# Patient Record
Sex: Male | Born: 1990 | Race: Black or African American | Hispanic: No | Marital: Single | Smoking: Never smoker
Health system: Southern US, Community
[De-identification: ages and names within clinical notes are randomized; demographics above are authoritative.]

---

## 2013-08-23 ENCOUNTER — Emergency Department: Payer: Self-pay | Admitting: Emergency Medicine

## 2013-09-10 ENCOUNTER — Emergency Department: Payer: Self-pay | Admitting: Emergency Medicine

## 2013-09-15 ENCOUNTER — Emergency Department: Payer: Self-pay | Admitting: Emergency Medicine

## 2013-09-16 ENCOUNTER — Emergency Department: Payer: Self-pay | Admitting: Emergency Medicine

## 2014-06-15 ENCOUNTER — Emergency Department: Payer: Self-pay | Admitting: Emergency Medicine

## 2014-06-15 LAB — URINALYSIS, COMPLETE
BACTERIA: NONE SEEN
BLOOD: NEGATIVE
Bilirubin,UR: NEGATIVE
GLUCOSE, UR: NEGATIVE mg/dL (ref 0–75)
KETONE: NEGATIVE
LEUKOCYTE ESTERASE: NEGATIVE
NITRITE: NEGATIVE
PH: 5 (ref 4.5–8.0)
Protein: NEGATIVE
SPECIFIC GRAVITY: 1.029 (ref 1.003–1.030)
Squamous Epithelial: <1

## 2014-06-15 LAB — COMPREHENSIVE METABOLIC PANEL
Albumin: 3.7 g/dL (ref 3.4–5.0)
Alkaline Phosphatase: 80 U/L
Anion Gap: 8 (ref 7–16)
BILIRUBIN TOTAL: 0.5 mg/dL (ref 0.2–1.0)
BUN: 13 mg/dL (ref 7–18)
Calcium, Total: 8.8 mg/dL (ref 8.5–10.1)
Chloride: 105 mmol/L (ref 98–107)
Co2: 29 mmol/L (ref 21–32)
Creatinine: 1.09 mg/dL (ref 0.60–1.30)
EGFR (African American): >60
EGFR (Non-African Amer.): >60
Glucose: 107 mg/dL — ABNORMAL HIGH (ref 65–99)
Osmolality: 284 (ref 275–301)
Potassium: 3.7 mmol/L (ref 3.5–5.1)
SGOT(AST): 20 U/L (ref 15–37)
SGPT (ALT): 26 U/L (ref 12–78)
Sodium: 142 mmol/L (ref 136–145)
TOTAL PROTEIN: 7.5 g/dL (ref 6.4–8.2)

## 2014-06-15 LAB — CBC WITH DIFFERENTIAL/PLATELET
BASOS ABS: 0 10*3/uL (ref 0.0–0.1)
Basophil %: 0.2 %
EOS PCT: 0.8 %
Eosinophil #: 0 10*3/uL (ref 0.0–0.7)
HCT: 43.3 % (ref 40.0–52.0)
HGB: 14.1 g/dL (ref 13.0–18.0)
LYMPHS ABS: 0.9 10*3/uL — AB (ref 1.0–3.6)
Lymphocyte %: 19.4 %
MCH: 30.3 pg (ref 26.0–34.0)
MCHC: 32.7 g/dL (ref 32.0–36.0)
MCV: 93 fL (ref 80–100)
MONOS PCT: 11.5 %
Monocyte #: 0.5 x10 3/mm (ref 0.2–1.0)
NEUTROS PCT: 68.1 %
Neutrophil #: 3.2 10*3/uL (ref 1.4–6.5)
PLATELETS: 153 10*3/uL (ref 150–440)
RBC: 4.66 10*6/uL (ref 4.40–5.90)
RDW: 13 % (ref 11.5–14.5)
WBC: 4.7 10*3/uL (ref 3.8–10.6)

## 2014-06-15 LAB — CK: CK, Total: 310 U/L — ABNORMAL HIGH

## 2014-06-15 LAB — TROPONIN I: Troponin-I: <0.02 ng/mL

## 2014-06-15 LAB — LIPASE, BLOOD: Lipase: 143 U/L (ref 73–393)

## 2014-08-13 ENCOUNTER — Emergency Department: Payer: Self-pay | Admitting: Emergency Medicine

## 2014-08-13 LAB — COMPREHENSIVE METABOLIC PANEL
ALBUMIN: 3.8 g/dL (ref 3.4–5.0)
ALT: 30 U/L
ANION GAP: 9 (ref 7–16)
Alkaline Phosphatase: 64 U/L
BUN: 15 mg/dL (ref 7–18)
Bilirubin,Total: 0.4 mg/dL (ref 0.2–1.0)
CALCIUM: 9 mg/dL (ref 8.5–10.1)
CHLORIDE: 108 mmol/L — AB (ref 98–107)
CREATININE: 1.07 mg/dL (ref 0.60–1.30)
Co2: 24 mmol/L (ref 21–32)
GLUCOSE: 84 mg/dL (ref 65–99)
Osmolality: 281 (ref 275–301)
POTASSIUM: 3.8 mmol/L (ref 3.5–5.1)
SGOT(AST): 27 U/L (ref 15–37)
Sodium: 141 mmol/L (ref 136–145)
Total Protein: 7.1 g/dL (ref 6.4–8.2)

## 2014-08-13 LAB — CBC
HCT: 42.5 % (ref 40.0–52.0)
HGB: 13.8 g/dL (ref 13.0–18.0)
MCH: 30.4 pg (ref 26.0–34.0)
MCHC: 32.5 g/dL (ref 32.0–36.0)
MCV: 94 fL (ref 80–100)
PLATELETS: 151 10*3/uL (ref 150–440)
RBC: 4.54 10*6/uL (ref 4.40–5.90)
RDW: 13.9 % (ref 11.5–14.5)
WBC: 3.9 10*3/uL (ref 3.8–10.6)

## 2014-08-13 LAB — LIPASE, BLOOD: Lipase: 160 U/L (ref 73–393)

## 2014-09-03 ENCOUNTER — Emergency Department: Payer: Self-pay | Admitting: Emergency Medicine

## 2014-09-03 LAB — URINALYSIS, COMPLETE
BACTERIA: NONE SEEN
BILIRUBIN, UR: NEGATIVE
Blood: NEGATIVE
GLUCOSE, UR: NEGATIVE mg/dL (ref 0–75)
KETONE: NEGATIVE
LEUKOCYTE ESTERASE: NEGATIVE
NITRITE: NEGATIVE
Ph: 7 (ref 4.5–8.0)
Protein: NEGATIVE
Specific Gravity: 1.014 (ref 1.003–1.030)
Squamous Epithelial: <1
WBC UR: 2 /HPF (ref 0–5)

## 2014-09-03 LAB — CBC WITH DIFFERENTIAL/PLATELET
BASOS ABS: 0 10*3/uL (ref 0.0–0.1)
BASOS PCT: 0.5 %
EOS ABS: 0.1 10*3/uL (ref 0.0–0.7)
EOS PCT: 2 %
HCT: 40 % (ref 40.0–52.0)
HGB: 13.6 g/dL (ref 13.0–18.0)
LYMPHS ABS: 2.4 10*3/uL (ref 1.0–3.6)
Lymphocyte %: 41.5 %
MCH: 30.9 pg (ref 26.0–34.0)
MCHC: 33.9 g/dL (ref 32.0–36.0)
MCV: 91 fL (ref 80–100)
MONOS PCT: 8.6 %
Monocyte #: 0.5 x10 3/mm (ref 0.2–1.0)
NEUTROS PCT: 47.4 %
Neutrophil #: 2.7 10*3/uL (ref 1.4–6.5)
PLATELETS: 149 10*3/uL — AB (ref 150–440)
RBC: 4.39 10*6/uL — ABNORMAL LOW (ref 4.40–5.90)
RDW: 13.5 % (ref 11.5–14.5)
WBC: 5.7 10*3/uL (ref 3.8–10.6)

## 2014-09-03 LAB — LIPASE, BLOOD: Lipase: 173 U/L (ref 73–393)

## 2014-09-03 LAB — COMPREHENSIVE METABOLIC PANEL
ANION GAP: 5 — AB (ref 7–16)
AST: 22 U/L (ref 15–37)
Albumin: 3.7 g/dL (ref 3.4–5.0)
Alkaline Phosphatase: 65 U/L
BUN: 17 mg/dL (ref 7–18)
Bilirubin,Total: 0.4 mg/dL (ref 0.2–1.0)
CALCIUM: 8.6 mg/dL (ref 8.5–10.1)
CO2: 29 mmol/L (ref 21–32)
Chloride: 103 mmol/L (ref 98–107)
Creatinine: 0.97 mg/dL (ref 0.60–1.30)
EGFR (African American): >60
EGFR (Non-African Amer.): >60
Glucose: 87 mg/dL (ref 65–99)
OSMOLALITY: 275 (ref 275–301)
Potassium: 3.7 mmol/L (ref 3.5–5.1)
SGPT (ALT): 28 U/L
SODIUM: 137 mmol/L (ref 136–145)
TOTAL PROTEIN: 6.8 g/dL (ref 6.4–8.2)

## 2014-09-08 ENCOUNTER — Emergency Department: Payer: Self-pay | Admitting: Emergency Medicine

## 2014-09-08 LAB — CBC WITH DIFFERENTIAL/PLATELET
BASOS ABS: 0 10*3/uL (ref 0.0–0.1)
Basophil %: 0.5 %
EOS ABS: 0 10*3/uL (ref 0.0–0.7)
EOS PCT: 0.7 %
HCT: 43.1 % (ref 40.0–52.0)
HGB: 14 g/dL (ref 13.0–18.0)
LYMPHS ABS: 1.5 10*3/uL (ref 1.0–3.6)
Lymphocyte %: 30.2 %
MCH: 30.1 pg (ref 26.0–34.0)
MCHC: 32.4 g/dL (ref 32.0–36.0)
MCV: 93 fL (ref 80–100)
Monocyte #: 0.3 x10 3/mm (ref 0.2–1.0)
Monocyte %: 6.1 %
NEUTROS ABS: 3 10*3/uL (ref 1.4–6.5)
Neutrophil %: 62.5 %
PLATELETS: 151 10*3/uL (ref 150–440)
RBC: 4.64 10*6/uL (ref 4.40–5.90)
RDW: 13.5 % (ref 11.5–14.5)
WBC: 4.9 10*3/uL (ref 3.8–10.6)

## 2014-09-08 LAB — COMPREHENSIVE METABOLIC PANEL
ALBUMIN: 4 g/dL (ref 3.4–5.0)
ALK PHOS: 54 U/L
ANION GAP: 7 (ref 7–16)
AST: 27 U/L (ref 15–37)
BILIRUBIN TOTAL: 0.7 mg/dL (ref 0.2–1.0)
BUN: 12 mg/dL (ref 7–18)
CHLORIDE: 105 mmol/L (ref 98–107)
Calcium, Total: 8.7 mg/dL (ref 8.5–10.1)
Co2: 29 mmol/L (ref 21–32)
Creatinine: 1.09 mg/dL (ref 0.60–1.30)
EGFR (African American): >60
GLUCOSE: 106 mg/dL — AB (ref 65–99)
Osmolality: 281 (ref 275–301)
POTASSIUM: 3.5 mmol/L (ref 3.5–5.1)
SGPT (ALT): 28 U/L
Sodium: 141 mmol/L (ref 136–145)
Total Protein: 7.4 g/dL (ref 6.4–8.2)

## 2014-09-08 LAB — URINALYSIS, COMPLETE
BILIRUBIN, UR: NEGATIVE
Bacteria: NONE SEEN
Blood: NEGATIVE
GLUCOSE, UR: NEGATIVE mg/dL (ref 0–75)
Leukocyte Esterase: NEGATIVE
Nitrite: NEGATIVE
Ph: 7 (ref 4.5–8.0)
Protein: NEGATIVE
RBC,UR: NONE SEEN /HPF (ref 0–5)
SPECIFIC GRAVITY: 1.017 (ref 1.003–1.030)
SQUAMOUS EPITHELIAL: NONE SEEN
WBC UR: NONE SEEN /HPF (ref 0–5)

## 2014-09-09 LAB — TROPONIN I

## 2014-12-11 ENCOUNTER — Emergency Department: Payer: Self-pay | Admitting: Emergency Medicine

## 2015-03-08 ENCOUNTER — Emergency Department
Admit: 2015-03-08 | Disposition: A | Discharge status: Home / Self Care | Payer: Self-pay | Admitting: Emergency Medicine

## 2015-03-08 LAB — URINALYSIS, COMPLETE
Bacteria: NONE SEEN
Bilirubin,UR: NEGATIVE
Blood: NEGATIVE
GLUCOSE, UR: NEGATIVE mg/dL (ref 0–75)
Ketone: NEGATIVE
LEUKOCYTE ESTERASE: NEGATIVE
NITRITE: NEGATIVE
Ph: 5 (ref 4.5–8.0)
Protein: NEGATIVE
Specific Gravity: 1.032 (ref 1.003–1.030)
Squamous Epithelial: NONE SEEN

## 2015-03-08 LAB — CBC WITH DIFFERENTIAL/PLATELET
Basophil #: 0 10*3/uL (ref 0.0–0.1)
Basophil %: 0.4 %
EOS ABS: 0.1 10*3/uL (ref 0.0–0.7)
Eosinophil %: 1.3 %
HCT: 42.6 % (ref 40.0–52.0)
HGB: 14 g/dL (ref 13.0–18.0)
Lymphocyte #: 2.3 10*3/uL (ref 1.0–3.6)
Lymphocyte %: 41 %
MCH: 30.1 pg (ref 26.0–34.0)
MCHC: 32.9 g/dL (ref 32.0–36.0)
MCV: 91 fL (ref 80–100)
MONO ABS: 0.5 x10 3/mm (ref 0.2–1.0)
Monocyte %: 9.1 %
NEUTROS PCT: 48.2 %
Neutrophil #: 2.7 10*3/uL (ref 1.4–6.5)
PLATELETS: 147 10*3/uL — AB (ref 150–440)
RBC: 4.66 10*6/uL (ref 4.40–5.90)
RDW: 13.2 % (ref 11.5–14.5)
WBC: 5.7 10*3/uL (ref 3.8–10.6)

## 2015-03-08 LAB — COMPREHENSIVE METABOLIC PANEL
ALK PHOS: 54 U/L
ANION GAP: 10 (ref 7–16)
Albumin: 4.5 g/dL
BUN: 16 mg/dL
Bilirubin,Total: 0.3 mg/dL
CALCIUM: 9.2 mg/dL
CO2: 26 mmol/L
Chloride: 105 mmol/L
Creatinine: 0.83 mg/dL
EGFR (African American): >60
EGFR (Non-African Amer.): >60
Glucose: 99 mg/dL
Potassium: 3.4 mmol/L — ABNORMAL LOW
SGOT(AST): 50 U/L — ABNORMAL HIGH
SGPT (ALT): 80 U/L — ABNORMAL HIGH
SODIUM: 141 mmol/L
Total Protein: 7.5 g/dL

## 2015-04-01 ENCOUNTER — Emergency Department: Payer: Medicaid Other

## 2015-04-01 ENCOUNTER — Emergency Department
Admission: EM | Admit: 2015-04-01 | Discharge: 2015-04-01 | Disposition: A | Discharge status: Home / Self Care | Payer: Medicaid Other | Attending: Emergency Medicine | Admitting: Emergency Medicine

## 2015-04-01 ENCOUNTER — Encounter: Payer: Self-pay | Admitting: Emergency Medicine

## 2015-04-01 DIAGNOSIS — N179 Acute kidney failure, unspecified: Secondary | ICD-10-CM | POA: Insufficient documentation

## 2015-04-01 DIAGNOSIS — R1011 Right upper quadrant pain: Secondary | ICD-10-CM

## 2015-04-01 DIAGNOSIS — G8929 Other chronic pain: Secondary | ICD-10-CM | POA: Insufficient documentation

## 2015-04-01 DIAGNOSIS — N289 Disorder of kidney and ureter, unspecified: Secondary | ICD-10-CM

## 2015-04-01 DIAGNOSIS — R1033 Periumbilical pain: Secondary | ICD-10-CM | POA: Insufficient documentation

## 2015-04-01 LAB — COMPREHENSIVE METABOLIC PANEL
ALBUMIN: 4.5 g/dL (ref 3.5–5.0)
ALT: 32 U/L (ref 17–63)
ANION GAP: 8 (ref 5–15)
AST: 32 U/L (ref 15–41)
Alkaline Phosphatase: 55 U/L (ref 38–126)
BILIRUBIN TOTAL: 0.7 mg/dL (ref 0.3–1.2)
BUN: 21 mg/dL — AB (ref 6–20)
CO2: 29 mmol/L (ref 22–32)
CREATININE: 1.26 mg/dL — AB (ref 0.61–1.24)
Calcium: 9.6 mg/dL (ref 8.9–10.3)
Chloride: 105 mmol/L (ref 101–111)
Glucose, Bld: 92 mg/dL (ref 65–99)
Potassium: 4 mmol/L (ref 3.5–5.1)
Sodium: 142 mmol/L (ref 135–145)
Total Protein: 7.7 g/dL (ref 6.5–8.1)

## 2015-04-01 LAB — URINALYSIS COMPLETE WITH MICROSCOPIC (ARMC ONLY)
Bacteria, UA: NONE SEEN
Bilirubin Urine: NEGATIVE
GLUCOSE, UA: NEGATIVE mg/dL
HGB URINE DIPSTICK: NEGATIVE
Ketones, ur: NEGATIVE mg/dL
Leukocytes, UA: NEGATIVE
Nitrite: NEGATIVE
Protein, ur: NEGATIVE mg/dL
SPECIFIC GRAVITY, URINE: 1.03 (ref 1.005–1.030)
pH: 5 (ref 5.0–8.0)

## 2015-04-01 LAB — CBC WITH DIFFERENTIAL/PLATELET
BASOS ABS: 0 10*3/uL (ref 0–0.1)
Basophils Relative: 0 %
EOS PCT: 1 %
Eosinophils Absolute: 0.1 10*3/uL (ref 0–0.7)
HCT: 44.4 % (ref 40.0–52.0)
HEMOGLOBIN: 14.9 g/dL (ref 13.0–18.0)
Lymphocytes Relative: 35 %
Lymphs Abs: 1.8 10*3/uL (ref 1.0–3.6)
MCH: 30.6 pg (ref 26.0–34.0)
MCHC: 33.6 g/dL (ref 32.0–36.0)
MCV: 91.3 fL (ref 80.0–100.0)
Monocytes Absolute: 0.3 10*3/uL (ref 0.2–1.0)
Monocytes Relative: 7 %
NEUTROS PCT: 57 %
Neutro Abs: 2.9 10*3/uL (ref 1.4–6.5)
Platelets: 155 10*3/uL (ref 150–440)
RBC: 4.86 MIL/uL (ref 4.40–5.90)
RDW: 13.4 % (ref 11.5–14.5)
WBC: 5.1 10*3/uL (ref 3.8–10.6)

## 2015-04-01 LAB — LIPASE, BLOOD: Lipase: 30 U/L (ref 22–51)

## 2015-04-01 MED ORDER — GI COCKTAIL ~~LOC~~
30.0000 mL | Freq: Once | ORAL | Status: AC
  Administered 2015-04-01: 30 mL via ORAL

## 2015-04-01 MED ORDER — GI COCKTAIL ~~LOC~~
ORAL | Status: AC
  Administered 2015-04-01: 30 mL via ORAL
  Filled 2015-04-01: qty 30

## 2015-04-01 NOTE — Discharge Instructions (Signed)

## 2015-04-01 NOTE — ED Provider Notes (Signed)
[facility] Emergency Department Provider Note    ____________________________________________  Time seen: 3:15 PM  I have reviewed the triage vital signs and the nursing notes.   HISTORY  Chief Complaint Abdominal Pain       HPI Derek Fudgenthony R Kreamer Jr. is a 24 y.o. male with chronic abdominal pain and presents today with abdominal pain to the right upper quadrant which is worse with deep breathing. The pain started 1-2 hours ago. The patient said that he had onset of the pain immediately after eating a sandwich at [employer] where he works. He does not have any nausea or vomiting today. He says that he has had intermittent diarrhea over the last year but has none today. He describes the pain as a 4 out of 10 and a burning pain to his right upper quadrant. The patient that he never taken an antacid although has been to the ER multiple times for this same problem.He has had multiple CAT scans, one that showed possible inflammatory bowel disease. He and his father are concerned about possible kidney disease which the father says shows his mother and grandmother at 22 years old.  The patient denies any dysuria. He denies any testicular pain. He says he has some dull and mild right lower quadrant pain for the past year which is chronic and unchanged today.   History reviewed. No pertinent past medical history.  There are no active problems to display for this patient.   History reviewed. No pertinent past surgical history.  No current outpatient prescriptions on file.  Allergies Review of patient's allergies indicates no known allergies.  History reviewed. No pertinent family history.  Social History History  Substance Use Topics  . Smoking status: Never Smoker   . Smokeless tobacco: Not on file  . Alcohol Use: Yes    Review of Systems  Constitutional: Negative for fever. Eyes: Negative for visual changes. ENT: Negative for sore  throat. Cardiovascular: Negative for chest pain. Respiratory: Negative for shortness of breath. Gastrointestinal: Negative for vomiting and diarrhea. Positive for right upper quadrant abdominal pain.  Genitourinary: Negative for dysuria. Musculoskeletal: Negative for back pain. Skin: Negative for rash. Neurological: Negative for headaches, focal weakness or numbness.   10-point ROS otherwise negative.  ____________________________________________   PHYSICAL EXAM:  VITAL SIGNS: ED Triage Vitals  Enc Vitals Group     BP 04/01/15 1429 113/49 mmHg     Pulse Rate 04/01/15 1429 71     Resp 04/01/15 1429 20     Temp 04/01/15 1429 98.6 F (37 C)     Temp Source 04/01/15 1429 Oral     SpO2 04/01/15 1429 100 %     Weight 04/01/15 1429 180 lb (81.647 kg)     Height 04/01/15 1429 5\' 3"  (1.6 m)     Head Cir --      Peak Flow --      Pain Score 04/01/15 1430 5     Pain Loc --      Pain Edu? --      Excl. in GC? --      Constitutional: Alert and oriented. Well appearing and in no distress. Eyes: Conjunctivae are normal. PERRL. Normal extraocular movements. ENT   Head: Normocephalic and atraumatic.   Nose: No congestion/rhinnorhea.   Mouth/Throat: Mucous membranes are moist.   Neck: No stridor. Hematological/Lymphatic/Immunilogical: No cervical lymphadenopathy. Cardiovascular: Normal rate, regular rhythm. Normal and symmetric distal pulses are present in all extremities. No murmurs, rubs, or gallops. Respiratory:  Normal respiratory effort without tachypnea nor retractions. Breath sounds are clear and equal bilaterally. No wheezes/rales/rhonchi. Gastrointestinal: Soft . No distention. No abdominal bruits. There is no CVA tenderness. Mild tenderness to the right of the umbilicus without guarding or rebound. There is no tenderness over McBurney's point. The patient describes this pain is chronic and unchanged for the past year. Genitourinary: Deferred Musculoskeletal:  Nontender with normal range of motion in all extremities. No joint effusions.  No lower extremity tenderness nor edema. Neurologic:  Normal speech and language. No gross focal neurologic deficits are appreciated. Speech is normal. No gait instability. Skin:  Skin is warm, dry and intact. No rash noted. Psychiatric: Mood and affect are normal. Speech and behavior are normal. Patient exhibits appropriate insight and judgment.  ____________________________________________    LABS (pertinent positives/negatives)  Mildly elevated BUN/creatinine.  ____________________________________________   EKG    ____________________________________________    RADIOLOGY  NAD chest x-ray.  ____________________________________________   PROCEDURES  Procedure(s) performed:   Critical Care performed:   ____________________________________________   INITIAL IMPRESSION / ASSESSMENT AND PLAN / ED COURSE  Pertinent labs & imaging results that were available during my care of the patient were reviewed by me and considered in my medical decision making (see chart for details).  ----------------------------------------- 4:28 PM on 04/01/2015 -----------------------------------------  Patient says that the pain is now a 2 out of 10 after GI cocktail.  Exact cause of the abdominal pain is unclear. However, the pain is ongoing and chronic. The patient has been having issues with his insurance and has been unable to follow up with [facility]. He says that he no longer has active Medicaid. I counseled him the importance of following up with the primary care doctor for review of his labs and ongoing checking of his kidney function. His father was particularly concerned today because of kidney disease and death in the family. I discussed the kidney labs with the patient who says that he drinks plenty of fluids especially water. I counseled him that this was good and that he should  continue to stay hydrated. However, given his concern slight bump in his creatinine and be when her concerning and need to be followed in the office. The patient understood this and was given a copy of his lab results for the day to take with him to his follow-up appointment. He will be given follow-up with Derek Murphy who is familiar with. I feel at Derek Murphy health resources to help him given his insurance status. The patient is in no distress and will be discharged home. Labs are normal and lower he needs to be admitted to the hospital at this time. However, he will need him to be rechecked in the office.  ____________________________________________   FINAL CLINICAL IMPRESSION(S) / ED DIAGNOSES  Acute on chronic abdominal pain. Mild acute renal failure. Acute, return visit.   Arelia Longestavid M Brown Dunlap, MD 04/01/15 [phone]

## 2015-04-01 NOTE — ED Notes (Signed)
Pt to ed with c/o right side abd pain started today, occasional nausea and diarrhea. No acute distress noted.

## 2015-04-01 NOTE — ED Notes (Signed)
No topaz pad in CDU

## 2015-04-01 NOTE — ED Notes (Signed)
Pt ambulatory to lobby. NAD.

## 2015-04-01 NOTE — ED Notes (Signed)
Pt reports that he has been here 10 times for abd pain and has been told that he needs a colonoscopy. He hasn't f/u because of insurance. Pt reports that he was at work and developed RUQ pain about an hour after eating. He states that it hurts worse when he coughs or yawns.

## 2015-04-21 ENCOUNTER — Encounter: Payer: Self-pay | Admitting: Emergency Medicine

## 2015-04-21 ENCOUNTER — Emergency Department
Admission: EM | Admit: 2015-04-21 | Discharge: 2015-04-21 | Disposition: A | Discharge status: Home / Self Care | Payer: Medicaid Other | Attending: Emergency Medicine | Admitting: Emergency Medicine

## 2015-04-21 DIAGNOSIS — R112 Nausea with vomiting, unspecified: Secondary | ICD-10-CM

## 2015-04-21 DIAGNOSIS — H538 Other visual disturbances: Secondary | ICD-10-CM | POA: Diagnosis not present

## 2015-04-21 DIAGNOSIS — F10129 Alcohol abuse with intoxication, unspecified: Secondary | ICD-10-CM | POA: Diagnosis present

## 2015-04-21 DIAGNOSIS — F10120 Alcohol abuse with intoxication, uncomplicated: Secondary | ICD-10-CM | POA: Diagnosis not present

## 2015-04-21 DIAGNOSIS — F10920 Alcohol use, unspecified with intoxication, uncomplicated: Secondary | ICD-10-CM

## 2015-04-21 DIAGNOSIS — K2970 Gastritis, unspecified, without bleeding: Secondary | ICD-10-CM | POA: Insufficient documentation

## 2015-04-21 DIAGNOSIS — R0602 Shortness of breath: Secondary | ICD-10-CM | POA: Insufficient documentation

## 2015-04-21 LAB — CBC WITH DIFFERENTIAL/PLATELET
Band Neutrophils: 0 % (ref 0–10)
Basophils Absolute: 0 10*3/uL (ref 0.0–0.1)
Basophils Relative: 0 % (ref 0–1)
Blasts: 0 %
Eosinophils Absolute: 0 10*3/uL (ref 0.0–0.7)
Eosinophils Relative: 0 % (ref 0–5)
HCT: 44.3 % (ref 40.0–52.0)
Hemoglobin: 14.7 g/dL (ref 13.0–18.0)
LYMPHS PCT: 32 % (ref 12–46)
Lymphs Abs: 2.6 10*3/uL (ref 0.7–4.0)
MCH: 30.5 pg (ref 26.0–34.0)
MCHC: 33.2 g/dL (ref 32.0–36.0)
MCV: 91.9 fL (ref 80.0–100.0)
METAMYELOCYTES PCT: 0 %
MONO ABS: 0.5 10*3/uL (ref 0.1–1.0)
Monocytes Relative: 6 % (ref 3–12)
Myelocytes: 0 %
NEUTROS ABS: 4.9 10*3/uL (ref 1.7–7.7)
Neutrophils Relative %: 62 % (ref 43–77)
OTHER: 0 %
PLATELETS: 139 10*3/uL — AB (ref 150–440)
PROMYELOCYTES ABS: 0 %
RBC: 4.82 MIL/uL (ref 4.40–5.90)
RDW: 13.4 % (ref 11.5–14.5)
WBC: 8 10*3/uL (ref 3.8–10.6)
nRBC: 0 /100 WBC

## 2015-04-21 LAB — COMPREHENSIVE METABOLIC PANEL
ALBUMIN: 4.9 g/dL (ref 3.5–5.0)
ALT: 20 U/L (ref 17–63)
AST: 32 U/L (ref 15–41)
Alkaline Phosphatase: 56 U/L (ref 38–126)
Anion gap: 13 (ref 5–15)
BILIRUBIN TOTAL: 0.8 mg/dL (ref 0.3–1.2)
BUN: 13 mg/dL (ref 6–20)
CALCIUM: 9.3 mg/dL (ref 8.9–10.3)
CO2: 25 mmol/L (ref 22–32)
CREATININE: 1.23 mg/dL (ref 0.61–1.24)
Chloride: 102 mmol/L (ref 101–111)
GFR calc Af Amer: >60 mL/min (ref >60)
GFR calc non Af Amer: >60 mL/min (ref >60)
Glucose, Bld: 135 mg/dL — ABNORMAL HIGH (ref 65–99)
POTASSIUM: 3.5 mmol/L (ref 3.5–5.1)
SODIUM: 140 mmol/L (ref 135–145)
Total Protein: 8.3 g/dL — ABNORMAL HIGH (ref 6.5–8.1)

## 2015-04-21 LAB — URINALYSIS COMPLETE WITH MICROSCOPIC (ARMC ONLY)
BACTERIA UA: NONE SEEN
BILIRUBIN URINE: NEGATIVE
Glucose, UA: NEGATIVE mg/dL
Hgb urine dipstick: NEGATIVE
Ketones, ur: NEGATIVE mg/dL
Leukocytes, UA: NEGATIVE
Nitrite: NEGATIVE
PH: 6 (ref 5.0–8.0)
Protein, ur: NEGATIVE mg/dL
SQUAMOUS EPITHELIAL / LPF: NONE SEEN
Specific Gravity, Urine: 1.006 (ref 1.005–1.030)

## 2015-04-21 LAB — ETHANOL: Alcohol, Ethyl (B): 121 mg/dL — ABNORMAL HIGH (ref <5)

## 2015-04-21 LAB — LIPASE, BLOOD: LIPASE: 28 U/L (ref 22–51)

## 2015-04-21 MED ORDER — FAMOTIDINE 40 MG PO TABS: 40.0000 mg | ORAL_TABLET | Freq: Every evening | ORAL | Status: DC

## 2015-04-21 MED ORDER — SODIUM CHLORIDE 0.9 % IV BOLUS (SEPSIS)
1000.0000 mL | Freq: Once | INTRAVENOUS | Status: AC
  Administered 2015-04-21: 1000 mL via INTRAVENOUS

## 2015-04-21 NOTE — ED Provider Notes (Signed)
[facility] Emergency Department Provider Note  ____________________________________________  Time seen: Approximately 257 AM  I have reviewed the triage vital signs and the nursing notes.   HISTORY  Chief Complaint Alcohol Intoxication and Abdominal Pain    HPI Derek Hollett. is a 24 y.o. male comes in with abdominal pain. The patient reports that the pain started tonight. The patient reports that he was drinking earlier and felt nauseous. He reports that he had knee and anything today so he started vomiting. He reports that his pain was worse than usual as he does have some chronic abdominal pain. He reports that the pain in his stomach felt like it was spasming of his stomach when he was vomiting. His friends told him to get up and he reports that he was unable to do so. They asked him if he needed to call an ambulance so he said yes. The patient reports that the pain was in his entire abdomen but may be in the left side left lower. He reports that he only drank 4 shots of liquor so he does not understand why he had some much pain. The patient reports he started drinking at approximately 2100 and the pain started at 2300. He reports it started after vomiting. He reports currently though that this pain is a 3-4 out of 10 in intensity. The patient was seen earlier this month for belly pain as well. He was told to follow-up with GI.   History reviewed. No pertinent past medical history.  There are no active problems to display for this patient.   History reviewed. No pertinent past surgical history.  Current Outpatient Rx  Name  Route  Sig  Dispense  Refill  . famotidine (PEPCID) 40 MG tablet   Oral   Take 1 tablet (40 mg total) by mouth every evening.   15 tablet   0     Allergies Review of patient's allergies indicates no known allergies.  No family history on file.  Social History History  Substance Use Topics  . Smoking status: Never Smoker    . Smokeless tobacco: Not on file  . Alcohol Use: Yes    Review of Systems Constitutional: No fever/chills Eyes: Blurred vision ENT: No sore throat. Cardiovascular: Denies chest pain. Respiratory:  shortness of breath. Gastrointestinal: Abdominal pain and vomiting Genitourinary: Negative for dysuria. Musculoskeletal: Negative for back pain. Skin: Negative for rash. Neurological: Negative for headaches, focal weakness or numbness.  10-point ROS otherwise negative.  ____________________________________________   PHYSICAL EXAM:  VITAL SIGNS: ED Triage Vitals  Enc Vitals Group     BP 04/21/15 0123 116/73 mmHg     Pulse Rate 04/21/15 0123 74     Resp 04/21/15 0326 18     Temp 04/21/15 0123 97.7 F (36.5 C)     Temp Source 04/21/15 0123 Oral     SpO2 04/21/15 0123 95 %     Weight 04/21/15 0123 180 lb (81.647 kg)     Height 04/21/15 0123  (1.6 m)     Head Cir --      Peak Flow --      Pain Score 04/21/15 0125 4     Pain Loc --      Pain Edu? --      Excl. in GC? --     Constitutional: Alert and oriented. Well appearing and in no acute distress. Eyes: Conjunctivae are normal. PERRL. EOMI. Head: Atraumatic. Nose: No congestion/rhinnorhea. Mouth/Throat: Mucous membranes are  moist.  Oropharynx non-erythematous. Cardiovascular: Normal rate, regular rhythm. Grossly normal heart sounds.  Good peripheral circulation. Respiratory: Normal respiratory effort.  No retractions. Lungs CTAB. Gastrointestinal: Soft and nontender. No distention. Positive bowel sounds Genitourinary: Deferred Musculoskeletal: No lower extremity tenderness nor edema.   Neurologic:  Normal speech and language. No gross focal neurologic deficits are appreciated.  Skin:  Skin is warm, dry and intact. No rash noted. Psychiatric: Mood and affect are normal. Speech and behavior are normal.  ____________________________________________   LABS (all labs ordered are listed, but only abnormal results  are displayed)  Labs Reviewed  ETHANOL - Abnormal; Notable for the following:    Alcohol, Ethyl (B) 121 (*)    All other components within normal limits  CBC WITH DIFFERENTIAL/PLATELET - Abnormal; Notable for the following:    Platelets 139 (*)    All other components within normal limits  COMPREHENSIVE METABOLIC PANEL - Abnormal; Notable for the following:    Glucose, Bld 135 (*)    Total Protein 8.3 (*)    All other components within normal limits  URINALYSIS COMPLETEWITH MICROSCOPIC ([facility])  - Abnormal; Notable for the following:    Color, Urine STRAW (*)    APPearance CLEAR (*)    All other components within normal limits  LIPASE, BLOOD   ____________________________________________  EKG  None ____________________________________________  RADIOLOGY  None ____________________________________________   PROCEDURES  Procedure(s) performed: None  Critical Care performed: No  ____________________________________________   INITIAL IMPRESSION / ASSESSMENT AND PLAN / ED COURSE  Pertinent labs & imaging results that were available during my care of the patient were reviewed by me and considered in my medical decision making (see chart for details).  The patient is a 24 year old male who comes in with abdominal pain after drinking and then subsequently vomiting. The patient currently is in minimal discomfort. The patient has been seen as abdominal pain previously. He does not have any pain to palpation. His blood alcohol was 121 although he reports he drank only 4 shots of liquor. I will send the patient home in the custody of a responsible adult who will take the patient home and have her follow-up with GI as previously discussed at his last visit.  The patient is in no distress. He'll be discharged home to follow-up with GI. I will give him some Pepcid to help with his gastritis pain and encourage decrease in alcohol  consumption. ____________________________________________   FINAL CLINICAL IMPRESSION(S) / ED DIAGNOSES  Final diagnoses:  Gastritis  Alcohol intoxication, uncomplicated  Non-intractable vomiting with nausea, vomiting of unspecified type      Rebecka ApleyAllison P Chelcee Korpi, MD 04/21/15 [phone]

## 2015-04-21 NOTE — ED Notes (Addendum)
Pt uprite on stretcher in exam room with no distress noted; reports being at a friend's house tonight drinking, about 3-4 shots; st now with nausea and vomited PTA; reports some right lower abd pain as well but has been recently treated for such; st was rx prednisone but not sure why and not taking it

## 2015-04-21 NOTE — Discharge Instructions (Signed)
Alcohol Intoxication Alcohol intoxication occurs when the amount of alcohol that a person has consumed impairs his or her ability to mentally and physically function. Alcohol directly impairs the normal chemical activity of the brain. Drinking large amounts of alcohol can lead to changes in mental function and behavior, and it can cause many physical effects that can be harmful.  Alcohol intoxication can range in severity from mild to very severe. Various factors can affect the level of intoxication that occurs, such as the person's age, gender, weight, frequency of alcohol consumption, and the presence of other medical conditions (such as diabetes, seizures, or heart conditions). Dangerous levels of alcohol intoxication may occur when people drink large amounts of alcohol in a short period (binge drinking). Alcohol can also be especially dangerous when combined with certain prescription medicines or "recreational" drugs. SIGNS AND SYMPTOMS Some common signs and symptoms of mild alcohol intoxication include:  Loss of coordination.  Changes in mood and behavior.  Impaired judgment.  Slurred speech. As alcohol intoxication progresses to more severe levels, other signs and symptoms will appear. These may include:  Vomiting.  Confusion and impaired memory.  Slowed breathing.  Seizures.  Loss of consciousness. DIAGNOSIS  Your health care provider will take a medical history and perform a physical exam. You will be asked about the amount and type of alcohol you have consumed. Blood tests will be done to measure the concentration of alcohol in your blood. In many places, your blood alcohol level must be lower than 80 mg/dL (2.84%0.08%) to legally drive. However, many dangerous effects of alcohol can occur at much lower levels.  TREATMENT  People with alcohol intoxication often do not require treatment. Most of the effects of alcohol intoxication are temporary, and they go away as the alcohol naturally  leaves the body. Your health care provider will monitor your condition until you are stable enough to go home. Fluids are sometimes given through an IV access tube to help prevent dehydration.  HOME CARE INSTRUCTIONS  Do not drive after drinking alcohol.  Stay hydrated. Drink enough water and fluids to keep your urine clear or pale yellow. Avoid caffeine.   Only take over-the-counter or prescription medicines as directed by your health care provider.  SEEK MEDICAL CARE IF:   You have persistent vomiting.   You do not feel better after a few days.  You have frequent alcohol intoxication. Your health care provider can help determine if you should see a substance use treatment counselor. SEEK IMMEDIATE MEDICAL CARE IF:   You become shaky or tremble when you try to stop drinking.   You shake uncontrollably (seizure).   You throw up (vomit) blood. This may be bright red or may look like black coffee grounds.   You have blood in your stool. This may be bright red or may appear as a black, tarry, bad smelling stool.   You become lightheaded or faint.  MAKE SURE YOU:   Understand these instructions.  Will watch your condition.  Will get help right away if you are not doing well or get worse. Document Released: 08/23/2005 Document Revised: 07/16/2013 Document Reviewed: 04/18/2013 [facility] Patient Information 2015 [place], [place]. This information is not intended to replace advice given to you by your health care provider. Make sure you discuss any questions you have with your health care provider.  Gastritis, Adult Gastritis is soreness and swelling (inflammation) of the lining of the stomach. Gastritis can develop as a sudden onset (acute) or long-term (chronic) condition.  If gastritis is not treated, it can lead to stomach bleeding and ulcers. CAUSES  Gastritis occurs when the stomach lining is weak or damaged. Digestive juices from the stomach then inflame the weakened  stomach lining. The stomach lining may be weak or damaged due to viral or bacterial infections. One common bacterial infection is the Helicobacter pylori infection. Gastritis can also result from excessive alcohol consumption, taking certain medicines, or having too much acid in the stomach.  SYMPTOMS  In some cases, there are no symptoms. When symptoms are present, they may include:  Pain or a burning sensation in the upper abdomen.  Nausea.  Vomiting.  An uncomfortable feeling of fullness after eating. DIAGNOSIS  Your caregiver may suspect you have gastritis based on your symptoms and a physical exam. To determine the cause of your gastritis, your caregiver may perform the following:  Blood or stool tests to check for the H pylori bacterium.  Gastroscopy. A thin, flexible tube (endoscope) is passed down the esophagus and into the stomach. The endoscope has a light and camera on the end. Your caregiver uses the endoscope to view the inside of the stomach.  Taking a tissue sample (biopsy) from the stomach to examine under a microscope. TREATMENT  Depending on the cause of your gastritis, medicines may be prescribed. If you have a bacterial infection, such as an H pylori infection, antibiotics may be given. If your gastritis is caused by too much acid in the stomach, H2 blockers or antacids may be given. Your caregiver may recommend that you stop taking aspirin, ibuprofen, or other nonsteroidal anti-inflammatory drugs (NSAIDs). HOME CARE INSTRUCTIONS  Only take over-the-counter or prescription medicines as directed by your caregiver.  If you were given antibiotic medicines, take them as directed. Finish them even if you start to feel better.  Drink enough fluids to keep your urine clear or pale yellow.  Avoid foods and drinks that make your symptoms worse, such as:  Caffeine or alcoholic drinks.  Chocolate.  Peppermint or mint flavorings.  Garlic and onions.  Spicy  foods.  Citrus fruits, such as oranges, lemons, or limes.  Tomato-based foods such as sauce, chili, salsa, and pizza.  Fried and fatty foods.  Eat small, frequent meals instead of large meals. SEEK IMMEDIATE MEDICAL CARE IF:   You have black or dark red stools.  You vomit blood or material that looks like coffee grounds.  You are unable to keep fluids down.  Your abdominal pain gets worse.  You have a fever.  You do not feel better after 1 week.  You have any other questions or concerns. MAKE SURE YOU:  Understand these instructions.  Will watch your condition.  Will get help right away if you are not doing well or get worse. Document Released: 11/07/2001 Document Revised: 05/14/2012 Document Reviewed: 12/27/2011 [facility] Patient Information 2015 [place], [place]. This information is not intended to replace advice given to you by your health care provider. Make sure you discuss any questions you have with your health care provider.

## 2015-05-27 ENCOUNTER — Encounter: Payer: Self-pay | Admitting: Emergency Medicine

## 2015-05-27 ENCOUNTER — Emergency Department
Admission: EM | Admit: 2015-05-27 | Discharge: 2015-05-27 | Disposition: A | Discharge status: Home / Self Care | Payer: Medicaid Other | Attending: Emergency Medicine | Admitting: Emergency Medicine

## 2015-05-27 DIAGNOSIS — Z79899 Other long term (current) drug therapy: Secondary | ICD-10-CM | POA: Insufficient documentation

## 2015-05-27 DIAGNOSIS — R109 Unspecified abdominal pain: Secondary | ICD-10-CM | POA: Insufficient documentation

## 2015-05-27 LAB — CBC WITH DIFFERENTIAL/PLATELET
BASOS ABS: 0 10*3/uL (ref 0–0.1)
BASOS PCT: 0 %
Eosinophils Absolute: 0.1 10*3/uL (ref 0–0.7)
Eosinophils Relative: 2 %
HEMATOCRIT: 43.3 % (ref 40.0–52.0)
HEMOGLOBIN: 14.4 g/dL (ref 13.0–18.0)
Lymphocytes Relative: 37 %
Lymphs Abs: 1.7 10*3/uL (ref 1.0–3.6)
MCH: 30.3 pg (ref 26.0–34.0)
MCHC: 33.2 g/dL (ref 32.0–36.0)
MCV: 91.2 fL (ref 80.0–100.0)
Monocytes Absolute: 0.4 10*3/uL (ref 0.2–1.0)
Monocytes Relative: 9 %
NEUTROS PCT: 52 %
Neutro Abs: 2.3 10*3/uL (ref 1.4–6.5)
PLATELETS: 146 10*3/uL — AB (ref 150–440)
RBC: 4.75 MIL/uL (ref 4.40–5.90)
RDW: 13.7 % (ref 11.5–14.5)
WBC: 4.5 10*3/uL (ref 3.8–10.6)

## 2015-05-27 LAB — URINALYSIS COMPLETE WITH MICROSCOPIC (ARMC ONLY)
BILIRUBIN URINE: NEGATIVE
Bacteria, UA: NONE SEEN
GLUCOSE, UA: NEGATIVE mg/dL
Hgb urine dipstick: NEGATIVE
Ketones, ur: NEGATIVE mg/dL
LEUKOCYTES UA: NEGATIVE
Nitrite: NEGATIVE
PROTEIN: NEGATIVE mg/dL
SPECIFIC GRAVITY, URINE: 1.025 (ref 1.005–1.030)
SQUAMOUS EPITHELIAL / LPF: NONE SEEN
pH: 6 (ref 5.0–8.0)

## 2015-05-27 LAB — COMPREHENSIVE METABOLIC PANEL
ALBUMIN: 4.4 g/dL (ref 3.5–5.0)
ALT: 25 U/L (ref 17–63)
AST: 27 U/L (ref 15–41)
Alkaline Phosphatase: 56 U/L (ref 38–126)
Anion gap: 6 (ref 5–15)
BUN: 17 mg/dL (ref 6–20)
CO2: 29 mmol/L (ref 22–32)
Calcium: 9.4 mg/dL (ref 8.9–10.3)
Chloride: 104 mmol/L (ref 101–111)
Creatinine, Ser: 1.16 mg/dL (ref 0.61–1.24)
GFR calc non Af Amer: >60 mL/min (ref >60)
Glucose, Bld: 98 mg/dL (ref 65–99)
Potassium: 4.1 mmol/L (ref 3.5–5.1)
Sodium: 139 mmol/L (ref 135–145)
TOTAL PROTEIN: 7.4 g/dL (ref 6.5–8.1)
Total Bilirubin: 0.8 mg/dL (ref 0.3–1.2)

## 2015-05-27 LAB — LIPASE, BLOOD: LIPASE: 52 U/L — AB (ref 22–51)

## 2015-05-27 MED ORDER — DICYCLOMINE HCL 20 MG PO TABS: 20.0000 mg | ORAL_TABLET | Freq: Three times a day (TID) | ORAL | Status: DC | PRN

## 2015-05-27 NOTE — ED Notes (Signed)
Patient c/o intermittent RLQ pain x1 year.

## 2015-05-27 NOTE — Discharge Instructions (Signed)
Please seek medical attention for any high fevers, chest pain, shortness of breath, change in behavior, persistent vomiting, bloody stool or any other new or concerning symptoms. ° °Abdominal Pain °Many things can cause abdominal pain. Usually, abdominal pain is not caused by a disease and will improve without treatment. It can often be observed and treated at home. Your health care provider will do a physical exam and possibly order blood tests and X-rays to help determine the seriousness of your pain. However, in many cases, more time must pass before a clear cause of the pain can be found. Before that point, your health care provider may not know if you need more testing or further treatment. °HOME CARE INSTRUCTIONS  °Monitor your abdominal pain for any changes. The following actions may help to alleviate any discomfort you are experiencing: °· Only take over-the-counter or prescription medicines as directed by your health care provider. °· Do not take laxatives unless directed to do so by your health care provider. °· Try a clear liquid diet (broth, tea, or water) as directed by your health care provider. Slowly move to a bland diet as tolerated. °SEEK MEDICAL CARE IF: °· You have unexplained abdominal pain. °· You have abdominal pain associated with nausea or diarrhea. °· You have pain when you urinate or have a bowel movement. °· You experience abdominal pain that wakes you in the night. °· You have abdominal pain that is worsened or improved by eating food. °· You have abdominal pain that is worsened with eating fatty foods. °· You have a fever. °SEEK IMMEDIATE MEDICAL CARE IF:  °· Your pain does not go away within 2 hours. °· You keep throwing up (vomiting). °· Your pain is felt only in portions of the abdomen, such as the right side or the left lower portion of the abdomen. °· You pass bloody or black tarry stools. °MAKE SURE YOU: °· Understand these instructions.   °· Will watch your condition.   °· Will  get help right away if you are not doing well or get worse.   °Document Released: 08/23/2005 Document Revised: 11/18/2013 Document Reviewed: 07/23/2013 °Exit[employer]® Patient Information ©2015 [employer], [employer]. This information is not intended to replace advice given to you by your health care provider. Make sure you discuss any questions you have with your health care provider. ° °

## 2015-05-27 NOTE — ED Provider Notes (Signed)
[facility] Emergency Department Provider Note   ___________________________________________  Time seen: 1415  I have reviewed the triage vital signs and the nursing notes.   HISTORY  Chief Complaint Abdominal Pain   History limited by: Not Limited   HPI Derek Fudgenthony R Rothbauer Jr. is a 24 y.o. male who presents to the emergency department because of abdominal pain. He states this pain has been intermittent for the past year. It comes and goes. It can be moderate to severe. It is located primarily in the area umbilical and right abdomen. He has not noticed any alleviating factors. He states that of an heat will make it worse. He denies any recent bloody stools. Denies any vomiting.     History reviewed. No pertinent past medical history.  There are no active problems to display for this patient.   History reviewed. No pertinent past surgical history.  Current Outpatient Rx  Name  Route  Sig  Dispense  Refill  . famotidine (PEPCID) 40 MG tablet   Oral   Take 1 tablet (40 mg total) by mouth every evening.   15 tablet   0     Allergies Review of patient's allergies indicates no known allergies.  No family history on file.  Social History History  Substance Use Topics  . Smoking status: Never Smoker   . Smokeless tobacco: Not on file  . Alcohol Use: No     Comment: occasional    Review of Systems  Constitutional: Negative for fever. Cardiovascular: Negative for chest pain. Respiratory: Negative for shortness of breath. Gastrointestinal: Positive for abdominal pain Genitourinary: Negative for dysuria. Musculoskeletal: Negative for back pain. Skin: Negative for rash. Neurological: Negative for headaches, focal weakness or numbness.  10-point ROS otherwise negative.  ____________________________________________   PHYSICAL EXAM:  VITAL SIGNS: ED Triage Vitals  Enc Vitals Group     BP 05/27/15 1318 133/77 mmHg     Pulse Rate  05/27/15 1318 76     Resp 05/27/15 1318 17     Temp 05/27/15 1318 98.6 F (37 C)     Temp Source 05/27/15 1318 Oral     SpO2 05/27/15 1318 99 %     Weight 05/27/15 1318 180 lb (81.647 kg)     Height 05/27/15 1318 5\' 3"  (1.6 m)     Head Cir --      Peak Flow --      Pain Score 05/27/15 1318 3   Constitutional: Alert and oriented. Well appearing and in no distress. Eyes: Conjunctivae are normal. PERRL. Normal extraocular movements. ENT   Head: Normocephalic and atraumatic.   Nose: No congestion/rhinnorhea.   Mouth/Throat: Mucous membranes are moist.   Neck: No stridor. Hematological/Lymphatic/Immunilogical: No cervical lymphadenopathy. Cardiovascular: Normal rate, regular rhythm.  No murmurs, rubs, or gallops. Respiratory: Normal respiratory effort without tachypnea nor retractions. Breath sounds are clear and equal bilaterally. No wheezes/rales/rhonchi. Gastrointestinal: Soft and nontender. No distention. There is no CVA tenderness. Genitourinary: Deferred Musculoskeletal: Normal range of motion in all extremities. No joint effusions.  No lower extremity tenderness nor edema. Neurologic:  Normal speech and language. No gross focal neurologic deficits are appreciated. Speech is normal.  Skin:  Skin is warm, dry and intact. No rash noted. Psychiatric: Mood and affect are normal. Speech and behavior are normal. Patient exhibits appropriate insight and judgment.  ____________________________________________    LABS (pertinent positives/negatives)  Labs Reviewed  CBC WITH DIFFERENTIAL/PLATELET - Abnormal; Notable for the following:    Platelets 146 (*)  All other components within normal limits  LIPASE, BLOOD - Abnormal; Notable for the following:    Lipase 52 (*)    All other components within normal limits  URINALYSIS COMPLETEWITH MICROSCOPIC ([facility] ONLY) - Abnormal; Notable for the following:    Color, Urine YELLOW (*)    APPearance CLEAR (*)    All other  components within normal limits  COMPREHENSIVE METABOLIC PANEL     ____________________________________________   EKG  None  ____________________________________________    RADIOLOGY  None  ____________________________________________   PROCEDURES  Procedure(s) performed: None  Critical Care performed: No  ____________________________________________   INITIAL IMPRESSION / ASSESSMENT AND PLAN / ED COURSE  Pertinent labs & imaging results that were available during my care of the patient were reviewed by me and considered in my medical decision making (see chart for details).  She presents with a one-year history of intermittent abdominal pain. On exam no abdominal tenderness, soft. Had discussion with patient about meaning to follow up with primary care physician. Patient is been attempting to almost no having some insurance problems. Will give prescription for Bentyl. Patient is safe for discharge.  ____________________________________________   FINAL CLINICAL IMPRESSION(S) / ED DIAGNOSES  Final diagnoses:  Abdominal pain, unspecified abdominal location     Phineas Semen, MD 05/27/15 1428

## 2016-03-20 ENCOUNTER — Emergency Department: Payer: Medicaid Other

## 2016-03-20 ENCOUNTER — Emergency Department
Admission: EM | Admit: 2016-03-20 | Discharge: 2016-03-20 | Disposition: A | Discharge status: Home / Self Care | Payer: Medicaid Other | Attending: Emergency Medicine | Admitting: Emergency Medicine

## 2016-03-20 ENCOUNTER — Encounter: Payer: Self-pay | Admitting: Emergency Medicine

## 2016-03-20 DIAGNOSIS — B9789 Other viral agents as the cause of diseases classified elsewhere: Secondary | ICD-10-CM | POA: Insufficient documentation

## 2016-03-20 DIAGNOSIS — J028 Acute pharyngitis due to other specified organisms: Secondary | ICD-10-CM | POA: Insufficient documentation

## 2016-03-20 DIAGNOSIS — J029 Acute pharyngitis, unspecified: Secondary | ICD-10-CM

## 2016-03-20 LAB — POCT RAPID STREP A: STREPTOCOCCUS, GROUP A SCREEN (DIRECT): NEGATIVE

## 2016-03-20 MED ORDER — DEXAMETHASONE 4 MG PO TABS
10.0000 mg | ORAL_TABLET | Freq: Once | ORAL | Status: AC
  Administered 2016-03-20: 10 mg via ORAL
  Filled 2016-03-20: qty 2.5

## 2016-03-20 NOTE — ED Notes (Signed)
Pt c/o sore throat since about 1630 on Sunday; pain constant but varies in severity; pain up under right jaw; no airway obstruction noted; pt talking in complete coherent sentences

## 2016-03-20 NOTE — ED Provider Notes (Signed)
[facility] Emergency Department Provider Note  ____________________________________________  Time seen: Approximately 4:26 AM  I have reviewed the triage vital signs and the nursing notes.   HISTORY  Chief Complaint Sore Throat    HPI Derek Murphy. is a 25 y.o. male with no significant past medical history who presents with gradual onset sore throat, pain with swallowing, and subjective swelling in the right side of his upper neck.  He denies fever/chills, chest pain, shortness of breath, nausea, vomiting, diarrhea.  He reports that he does not have any known dental issues and no swelling inside his mouth.  He said the pain is constant, mild to moderate, nothing makes it better or worse although he has not taken any medication for it.  He states he feels like the right side of his neck or throat is swollen but he has not had any changes in his voice and no feeling of obstruction when he swallows, just pain.   History reviewed. No pertinent past medical history.  There are no active problems to display for this patient.   History reviewed. No pertinent past surgical history.  Current Outpatient Rx  Name  Route  Sig  Dispense  Refill  . dicyclomine (BENTYL) 20 MG tablet   Oral   Take 1 tablet (20 mg total) by mouth 3 (three) times daily as needed for spasms.   30 tablet   0   . famotidine (PEPCID) 40 MG tablet   Oral   Take 1 tablet (40 mg total) by mouth every evening.   15 tablet   0     Allergies Review of patient's allergies indicates no known allergies.  History reviewed. No pertinent family history.  Social History Social History  Substance Use Topics  . Smoking status: Never Smoker   . Smokeless tobacco: None  . Alcohol Use: Yes     Comment: occasional    Review of Systems Constitutional: No fever/chills Eyes: No visual changes. ENT: +sore throat. Cardiovascular: Denies chest pain. Respiratory: Denies shortness of  breath. Gastrointestinal: No abdominal pain.  No nausea, no vomiting.  No diarrhea.  No constipation. Genitourinary: Negative for dysuria. Musculoskeletal: Negative for back pain. Skin: Negative for rash. Neurological: Negative for headaches, focal weakness or numbness.  10-point ROS otherwise negative.  ____________________________________________   PHYSICAL EXAM:  VITAL SIGNS: ED Triage Vitals  Enc Vitals Group     BP 03/20/16 0416 135/82 mmHg     Pulse Rate 03/20/16 0416 79     Resp 03/20/16 0416 18     Temp 03/20/16 0416 99.4 F (37.4 C)     Temp Source 03/20/16 0416 Oral     SpO2 03/20/16 0416 100 %     Weight 03/20/16 0416 200 lb (90.719 kg)     Height 03/20/16 0416  (1.6 m)     Head Cir --      Peak Flow --      Pain Score 03/20/16 0417 3     Pain Loc --      Pain Edu? --      Excl. in GC? --     Constitutional: Alert and oriented. Well appearing and in no acute distress. Eyes: Conjunctivae are normal. PERRL. EOMI. Head: Atraumatic. Nose: No congestion/rhinnorhea. Mouth/Throat: Mucous membranes are moist.  Posterior oropharynx erythematous without exudate or petechiae and tonsils are normal in appearance.  There is no evidence of peritonsillar abscess.  There is no submandibular brawny induration, beneath the tongue is  soft and nontender, no evidence of Ludwig angina. Neck: No stridor.  No meningeal signs.  No cervical lymphadenopathy.  No tenderness to palpation of the neck.  No tenderness of the larynx.  Normal voice, not muffled. Cardiovascular: Normal rate, regular rhythm. Good peripheral circulation. Grossly normal heart sounds.   Respiratory: Normal respiratory effort.  No retractions. Lungs CTAB. Gastrointestinal: Soft and nontender. No distention.  Musculoskeletal: No lower extremity tenderness nor edema. No gross deformities of extremities. Neurologic:  Normal speech and language. No gross focal neurologic deficits are appreciated.  Skin:  Skin is  warm, dry and intact. No rash noted. Psychiatric: Mood and affect are normal. Speech and behavior are normal.  ____________________________________________   LABS (all labs ordered are listed, but only abnormal results are displayed)  Labs Reviewed  POCT RAPID STREP A   ____________________________________________  EKG  None ____________________________________________  RADIOLOGY   Dg Neck Soft Tissue  03/20/2016  CLINICAL DATA:  Sore throat and subjective right neck swelling. EXAM: NECK SOFT TISSUES - 1+ VIEW COMPARISON:  None. FINDINGS: Mild adenoid thickening, commonly seen at this age in asymptomatic patients. There is no retropharyngeal swelling or epiglottic thickening. The airway is patent. No air column displacement. IMPRESSION: Negative study. Electronically Signed   By: Marnee SpringJonathon  Watts M.D.   On: 03/20/2016 05:07    ____________________________________________   PROCEDURES  Procedure(s) performed: None  Critical Care performed: No ____________________________________________   INITIAL IMPRESSION / ASSESSMENT AND PLAN / ED COURSE  Pertinent labs & imaging results that were available during my care of the patient were reviewed by me and considered in my medical decision making (see chart for details).  Reassuring exam/workup.  Probable viral pharyngitis (neg rapid strep, culture sent).  Gave Decadron 10 mg PO for comfort, advised OTC meds and outpatient f/u.   I gave my usual and customary return precautions.    Patient understands and agrees with the plan.  ____________________________________________  FINAL CLINICAL IMPRESSION(S) / ED DIAGNOSES  Final diagnoses:  Viral pharyngitis      NEW MEDICATIONS STARTED DURING THIS VISIT:  New Prescriptions   No medications on file      Note:  This document was prepared using Dragon voice recognition software and may include unintentional dictation errors.   Loleta Roseory Auryn Paige, MD 03/20/16 [phone]

## 2016-03-20 NOTE — Discharge Instructions (Signed)

## 2016-03-21 ENCOUNTER — Inpatient Hospital Stay
Admission: EM | Admit: 2016-03-21 | Discharge: 2016-03-23 | DRG: 153 | Disposition: A | Discharge status: Home / Self Care | Payer: Self-pay | Attending: Internal Medicine | Admitting: Internal Medicine

## 2016-03-21 ENCOUNTER — Encounter: Payer: Self-pay | Admitting: *Deleted

## 2016-03-21 ENCOUNTER — Emergency Department: Payer: Self-pay

## 2016-03-21 DIAGNOSIS — J039 Acute tonsillitis, unspecified: Secondary | ICD-10-CM

## 2016-03-21 DIAGNOSIS — K122 Cellulitis and abscess of mouth: Secondary | ICD-10-CM | POA: Diagnosis present

## 2016-03-21 DIAGNOSIS — J36 Peritonsillar abscess: Principal | ICD-10-CM | POA: Diagnosis present

## 2016-03-21 DIAGNOSIS — J0510 Acute epiglottitis without obstruction: Secondary | ICD-10-CM | POA: Diagnosis present

## 2016-03-21 LAB — COMPREHENSIVE METABOLIC PANEL
ALK PHOS: 53 U/L (ref 38–126)
ALT: 23 U/L (ref 17–63)
ANION GAP: 10 (ref 5–15)
AST: 27 U/L (ref 15–41)
Albumin: 4.6 g/dL (ref 3.5–5.0)
BUN: 16 mg/dL (ref 6–20)
CALCIUM: 9.1 mg/dL (ref 8.9–10.3)
CO2: 26 mmol/L (ref 22–32)
CREATININE: 1.11 mg/dL (ref 0.61–1.24)
Chloride: 104 mmol/L (ref 101–111)
Glucose, Bld: 127 mg/dL — ABNORMAL HIGH (ref 65–99)
Potassium: 3.6 mmol/L (ref 3.5–5.1)
SODIUM: 140 mmol/L (ref 135–145)
Total Bilirubin: 0.7 mg/dL (ref 0.3–1.2)
Total Protein: 7.5 g/dL (ref 6.5–8.1)

## 2016-03-21 LAB — CBC
HCT: 41.2 % (ref 40.0–52.0)
HEMOGLOBIN: 14.2 g/dL (ref 13.0–18.0)
MCH: 30.6 pg (ref 26.0–34.0)
MCHC: 34.4 g/dL (ref 32.0–36.0)
MCV: 89 fL (ref 80.0–100.0)
PLATELETS: 146 10*3/uL — AB (ref 150–440)
RBC: 4.63 MIL/uL (ref 4.40–5.90)
RDW: 13.6 % (ref 11.5–14.5)
WBC: 12.1 10*3/uL — AB (ref 3.8–10.6)

## 2016-03-21 LAB — MONONUCLEOSIS SCREEN: MONO SCREEN: NEGATIVE

## 2016-03-21 MED ORDER — ACETAMINOPHEN 325 MG PO TABS: 650.0000 mg | ORAL_TABLET | Freq: Four times a day (QID) | ORAL | Status: DC | PRN

## 2016-03-21 MED ORDER — ONDANSETRON HCL 4 MG/2ML IJ SOLN: 4.0000 mg | Freq: Four times a day (QID) | INTRAMUSCULAR | Status: DC | PRN

## 2016-03-21 MED ORDER — DOCUSATE SODIUM 100 MG PO CAPS
100.0000 mg | ORAL_CAPSULE | Freq: Two times a day (BID) | ORAL | Status: DC
  Administered 2016-03-21 – 2016-03-22 (×2): 100 mg via ORAL
  Filled 2016-03-21 (×3): qty 1

## 2016-03-21 MED ORDER — BISACODYL 5 MG PO TBEC
5.0000 mg | DELAYED_RELEASE_TABLET | Freq: Every day | ORAL | Status: DC | PRN
  Administered 2016-03-22: 5 mg via ORAL
  Filled 2016-03-21: qty 1

## 2016-03-21 MED ORDER — SODIUM CHLORIDE 0.9 % IV BOLUS (SEPSIS)
1000.0000 mL | Freq: Once | INTRAVENOUS | Status: AC
  Administered 2016-03-21: 1000 mL via INTRAVENOUS

## 2016-03-21 MED ORDER — MORPHINE SULFATE (PF) 4 MG/ML IV SOLN
4.0000 mg | Freq: Once | INTRAVENOUS | Status: AC
  Administered 2016-03-21: 4 mg via INTRAVENOUS
  Filled 2016-03-21: qty 1

## 2016-03-21 MED ORDER — ACETAMINOPHEN 650 MG RE SUPP: 650.0000 mg | Freq: Four times a day (QID) | RECTAL | Status: DC | PRN

## 2016-03-21 MED ORDER — IOPAMIDOL (ISOVUE-300) INJECTION 61%
75.0000 mL | Freq: Once | INTRAVENOUS | Status: AC | PRN
  Administered 2016-03-21: 75 mL via INTRAVENOUS
  Filled 2016-03-21: qty 75

## 2016-03-21 MED ORDER — SODIUM CHLORIDE 0.9 % IV SOLN
INTRAVENOUS | Status: DC
  Administered 2016-03-21 – 2016-03-23 (×4): via INTRAVENOUS

## 2016-03-21 MED ORDER — HYDROMORPHONE HCL 1 MG/ML IJ SOLN
1.0000 mg | INTRAMUSCULAR | Status: DC | PRN
Start: 2016-03-21 — End: 2016-03-23
  Administered 2016-03-22: 1 mg via INTRAVENOUS
  Filled 2016-03-21: qty 1

## 2016-03-21 MED ORDER — SODIUM CHLORIDE 0.9 % IV SOLN
3.0000 g | Freq: Once | INTRAVENOUS | Status: AC
  Administered 2016-03-21: 3 g via INTRAVENOUS
  Filled 2016-03-21: qty 3

## 2016-03-21 MED ORDER — METHYLPREDNISOLONE SODIUM SUCC 125 MG IJ SOLR
125.0000 mg | Freq: Once | INTRAMUSCULAR | Status: AC
  Administered 2016-03-21: 125 mg via INTRAVENOUS
  Filled 2016-03-21: qty 2

## 2016-03-21 MED ORDER — SODIUM CHLORIDE 0.9 % IV SOLN
1.5000 g | Freq: Three times a day (TID) | INTRAVENOUS | Status: DC
  Filled 2016-03-21 (×3): qty 1.5

## 2016-03-21 MED ORDER — DEXAMETHASONE SODIUM PHOSPHATE 4 MG/ML IJ SOLN
4.0000 mg | Freq: Three times a day (TID) | INTRAMUSCULAR | Status: DC
  Administered 2016-03-21 – 2016-03-23 (×4): 4 mg via INTRAVENOUS
  Filled 2016-03-21 (×7): qty 1

## 2016-03-21 MED ORDER — DEXAMETHASONE SODIUM PHOSPHATE 4 MG/ML IJ SOLN
4.0000 mg | Freq: Two times a day (BID) | INTRAMUSCULAR | Status: DC
  Filled 2016-03-21: qty 1

## 2016-03-21 MED ORDER — ONDANSETRON HCL 4 MG/2ML IJ SOLN
4.0000 mg | Freq: Once | INTRAMUSCULAR | Status: AC
  Administered 2016-03-21: 4 mg via INTRAVENOUS
  Filled 2016-03-21: qty 2

## 2016-03-21 MED ORDER — SODIUM CHLORIDE 0.9 % IV SOLN
3.0000 g | Freq: Four times a day (QID) | INTRAVENOUS | Status: DC
  Administered 2016-03-21 – 2016-03-23 (×7): 3 g via INTRAVENOUS
  Filled 2016-03-21 (×10): qty 3

## 2016-03-21 MED ORDER — ONDANSETRON HCL 4 MG PO TABS
4.0000 mg | ORAL_TABLET | Freq: Four times a day (QID) | ORAL | Status: DC | PRN
  Administered 2016-03-22: 4 mg via ORAL
  Filled 2016-03-21: qty 1

## 2016-03-21 NOTE — ED Notes (Signed)
Pt returns to ED via EMS after visit yesterday. Pt moaning during assessment. States he has had increased pain and drooling, as well as nausea. Pt alert & oriented.

## 2016-03-21 NOTE — ED Notes (Addendum)
Pt states he was seen in ER yesterday and diagnosed with pharyngitis, states the throat pain has gotten worse over night and now feels as if Edward JollySilva is building up and is nauseas, states he is SOB because the Edward JollySilva is building up in her throat, pt speaking in full clear sentences in no distress, states headache and jaw pain, pt pacing in triage room

## 2016-03-21 NOTE — H&P (Signed)
[facility] [place] [facility]   PATIENT NAME: Derek Murphy    MR#:  [id number]  DATE OF BIRTH:  10-Jan-1991  DATE OF ADMISSION:  03/21/2016  PRIMARY CARE PHYSICIAN: No PCP Per Patient   REQUESTING/REFERRING PHYSICIAN: Dr. Lacretia NicksBrian quigley  CHIEF COMPLAINT: Sore throat,    Chief Complaint  Patient presents with  . Sore Throat  . Nausea    HISTORY OF PRESENT ILLNESS:  Derek Crutchnthony Kato  is a 25 y.o. male with A history of medical problems comes in because of a sore throat, painful swallowing, nausea, vomiting. Patient was seen in the emergency room yesterday morning and was treated for viral pharyngitis, patient was advised to eat soft food. Unable patient went home unable to keep anything down and he was only able to take popsicles. Patient having these symptoms since the weekend. Because of difficulty swallowing patient had a CT of the soft tissues of the neck showed small peritonsillar abscess on the right side with epiglottitis. Because of this we admitted the patient to observation status for IV antibiotics, close monitoring of his respiratory status. Patient right now denies any shortness of breath. No fever at home.  PAST MEDICAL HISTORY:  History reviewed. No pertinent past medical history.  no History of hypertension or diabetes. PAST SURGICAL HISTOIRY:  History reviewed. No pertinent past surgical history.  no Known history of prior surgeries. SOCIAL HISTORY:   Social History  Substance Use Topics  . Smoking status: Never Smoker   . Smokeless tobacco: Not on file  . Alcohol Use: Yes     Comment: occasional    FAMILY HISTORY:  History reviewed. No pertinent family history. Patient father has diabetes DRUG ALLERGIES:  No Known Allergies  REVIEW OF SYSTEMS:  CONSTITUTIONAL: No fever, fatigue or weakness.  EYES: No blurred or double vision.  EARS, NOSE, AND THROAT:  Patient has swelling of the right tonsil ,sore throat RESPIRATORY: No  cough, shortness of breath, wheezing or hemoptysis.  CARDIOVASCULAR: No chest pain, orthopnea, edema.  GASTROINTESTINAL: No nausea, vomiting, diarrhea or abdominal pain.  GENITOURINARY: No dysuria, hematuria.  ENDOCRINE: No polyuria, nocturia,  HEMATOLOGY: No anemia, easy bruising or bleeding SKIN: No rash or lesion. MUSCULOSKELETAL: No joint pain or arthritis.   NEUROLOGIC: No tingling, numbness, weakness.  PSYCHIATRY: No anxiety or depression.   MEDICATIONS AT HOME:   Prior to Admission medications   Not on File      VITAL SIGNS:  Blood pressure 140/71, pulse 66, temperature 98.7 F (37.1 C), temperature source Oral, resp. rate 17, height 5\' 3"  (1.6 m), weight 90.719 kg (200 lb), SpO2 97 %.  PHYSICAL EXAMINATION:  GENERAL:  25 y.o.-year-old patient lying in the bed with no acute distress.  EYES: Pupils equal, round, reactive to light and accommodation. No scleral icterus. Extraocular muscles intact.  HEENT: Head atraumatic, normocephalic. Patient has no evidence of trismus, mild edema of the  avula,, right tonsil is more swollen than the left without any purulence. Has tender lymphadenopathy in the right upper neck. NECK:  Supple, no jugular venous distention. No thyroid enlargement, lymphadenopathy in the right upper neck LUNGS: Normal breath sounds bilaterally, no wheezing, rales,rhonchi or crepitation. No use of accessory muscles of respiration.  CARDIOVASCULAR: S1, S2 normal. No murmurs, rubs, or gallops.  ABDOMEN: Soft, nontender, nondistended. Bowel sounds present. No organomegaly or mass.  EXTREMITIES: No pedal edema, cyanosis, or clubbing.  NEUROLOGIC: Cranial nerves II through XII are intact. Muscle strength 5/5 in all extremities. Sensation  intact. Gait not checked.  PSYCHIATRIC: The patient is alert and oriented x 3.  SKIN: No obvious rash, lesion, or ulcer.   LABORATORY PANEL:   CBC  Recent Labs Lab 03/21/16 0853  WBC 12.1*  HGB 14.2  HCT 41.2  PLT 146*    ------------------------------------------------------------------------------------------------------------------  Chemistries   Recent Labs Lab 03/21/16 0853  NA 140  K 3.6  CL 104  CO2 26  GLUCOSE 127*  BUN 16  CREATININE 1.11  CALCIUM 9.1  AST 27  ALT 23  ALKPHOS 53  BILITOT 0.7   ------------------------------------------------------------------------------------------------------------------  Cardiac Enzymes No results for input(s): TROPONINI in the last 168 hours. ------------------------------------------------------------------------------------------------------------------  RADIOLOGY:  Dg Neck Soft Tissue  03/20/2016  CLINICAL DATA:  Sore throat and subjective right neck swelling. EXAM: NECK SOFT TISSUES - 1+ VIEW COMPARISON:  None. FINDINGS: Mild adenoid thickening, commonly seen at this age in asymptomatic patients. There is no retropharyngeal swelling or epiglottic thickening. The airway is patent. No air column displacement. IMPRESSION: Negative study. Electronically Signed   By: Marnee Spring M.D.   On: 03/20/2016 05:07   Ct Soft Tissue Neck W Contrast  03/21/2016  CLINICAL DATA:  25 year old EXAM: CT NECK WITH CONTRAST TECHNIQUE: Multidetector CT imaging of the neck was performed using the standard protocol following the bolus administration of intravenous contrast. CONTRAST:  75mL ISOVUE-300 IOPAMIDOL (ISOVUE-300) INJECTION 61% COMPARISON:  None. FINDINGS: Pharynx and larynx: Diffuse inflammation of the right palatine tonsil. Within the enlarged inflamed right palatine tonsil, there is a 1.2 x 1 x 1 cm low-density component suggestive of contained abscess. Breakthrough of inflammatory process with inflammation in the right parapharyngeal space and retropharyngeal space/ prevertebral space. Streak artifacts slightly limits evaluation however, the inflammatory component within the right parapharyngeal space and retropharyngeal space currently does not have  well-defined borders to suggest well-defined drainable abscess in this region. Patient is at risk for development of deep abscess. The inflammatory process extends to level of the epiglottis (which is slightly thickened) and the right aryepiglottic region. Underfilling of the right piriform sinus secondary to surrounding inflammation. Salivary glands: No primary salivary gland abnormality. Thyroid: No primary thyroid abnormality. Lymph nodes: Diffuse adenopathy, greater right neck and most prominent in the level 2 region. Vascular: No thrombosis of the internal jugular vein. Limited intracranial: Negative. Visualized orbits: Only partially imaged and unremarkable. Mastoids and visualized paranasal sinuses: Probable retention cyst inferior left maxillary sinus. Skeleton: Lordosis of the cervical spine may be related to splinting. No obvious epidural extension of inflammatory process. Upper chest: Clear. IMPRESSION: Diffuse inflammation of the right palatine tonsil. Within the enlarged inflamed right palatine tonsil, there is a 1.2 x 1 x 1 cm low-density component suggestive of contained abscess. Breakthrough of inflammatory process with inflammation in the right parapharyngeal space and retropharyngeal space/ prevertebral space. Streak artifacts slightly limits evaluation however, the inflammatory component within the right parapharyngeal space and retropharyngeal space currently does not have well-defined borders to suggest well-defined drainable abscess in this region. The inflammatory process extends to level of the epiglottis (which is slightly thickened) and the right aryepiglottic region. Underfilling of the right piriform sinus secondary to surrounding inflammation. Diffuse adenopathy, greater right neck and most prominent in the level 2 region. These results were called by telephone at the time of interpretation on 03/21/2016 at 11:52 am to CARI TRIPLETT, emergency room physician's assistant, who verbally  acknowledged these results. Electronically Signed   By: Lacy Duverney M.D.   On: 03/21/2016 12:04    EKG:  Orders placed or performed in visit on 09/08/14  . EKG 12-Lead    IMPRESSION AND PLAN:  #1 sore throat secondary to tonsillitis with small peritonsillar abscess: Medically stable having no respiratory distress. Admitted to hospitalist service under observation, continue IV Unasyn, IV Decadron. ENT has seen the patient they recommended to keep the patient nothing by mouth in case patient needs to go to OR for incision and drainage  if the condition worsens..    All the records are reviewed and case discussed with ED provider. Management plans discussed with the patient, family and they are in agreement.  CODE STATUS:full  TOTAL TIME TAKING CARE OF THIS PATIENT: .    Katha Hamming M.D on 03/21/2016 at 6:16 PM  Between 7am to 6pm - Pager - [phone]  After 6pm go to [email or web address] - password [email or web address] [email or web address]  [facility]  Office  [phone]  CC: Primary care physician; No PCP Per Patient  Note: This dictation was prepared with Dragon dictation along with smaller phrase technology. Any transcriptional errors that result from this process are unintentional.

## 2016-03-21 NOTE — ED Provider Notes (Signed)
[facility] Emergency Department Provider Note  ____________________________________________  Time seen: Approximately 10:13 AM  I have reviewed the triage vital signs and the nursing notes.   HISTORY  Chief Complaint Sore Throat and Nausea   HPI Derek Fudgenthony R Torrance Jr. is a 25 y.o. male, NAD, presents today with severe sore throat, painful swallowing, nausea, and vomiting. He states he came in yesterday morning and was treatedfor viral pharyngitis. He states the medicine they gave him in the ED yesterday only slightly helped. The pain in his throat intensified early this morning and he began to become nauseous and vomit. He has only been able to eat popsicles over the past few days due to the throat pain. He has not tried any medications to help relieve the pain.  History reviewed. No pertinent past medical history.  Patient Active Problem List   Diagnosis Date Noted  . Tonsil, abscess 03/21/2016    History reviewed. No pertinent past surgical history.  No current outpatient prescriptions on file.  Allergies Review of patient's allergies indicates no known allergies.  History reviewed. No pertinent family history.  Social History Social History  Substance Use Topics  . Smoking status: Never Smoker   . Smokeless tobacco: None  . Alcohol Use: Yes     Comment: occasional    Review of Systems Constitutional: Negative for fever. Eyes: No visual changes. ENT: Positive for sore throat; Positive for difficulty swallowing. Positive for throat swelling. Respiratory: Denies shortness of breath. Gastrointestinal: No abdominal pain.  Positive for nausea, no vomiting.  No diarrhea.  Genitourinary: Negative for dysuria. Musculoskeletal: Negative for generalized body aches. Skin: Negative for rash. Neurological: Negative for headaches, focal weakness or numbness.  ____________________________________________   PHYSICAL EXAM:  VITAL SIGNS: ED Triage  Vitals  Enc Vitals Group     BP 03/21/16 0847 133/90 mmHg     Pulse Rate 03/21/16 0847 75     Resp 03/21/16 0847 22     Temp 03/21/16 0847 98.7 F (37.1 C)     Temp Source 03/21/16 0847 Oral     SpO2 03/21/16 0847 96 %     Weight 03/21/16 0847 200 lb (90.719 kg)     Height 03/21/16 0847 5\' 3"  (1.6 m)     Head Cir --      Peak Flow --      Pain Score 03/21/16 0851 6     Pain Loc --      Pain Edu? --      Excl. in GC? --    Constitutional: Alert and oriented. Ill appearing but in no acute distress. Eyes: Conjunctivae are normal. PERRL. EOMI. Head: Atraumatic. Nose: No congestion/rhinnorhea. Mouth/Throat: Mucous membranes are moist.  Oropharynx erythematous and edematous, exudate noted on exam. Tonsillar abscess noted on right. Mild trismus. Neck: No stridor.  Lymphatic: Lymphadenopathy noted along the right submandibular and tonsillar lymphnodes Cardiovascular: Normal rate, regular rhythm. Good peripheral circulation. Respiratory: Normal respiratory effort. Lungs CTAB. Gastrointestinal: Soft and nontender. Neurologic:  Normal speech and language. No gross focal neurologic deficits are appreciated. Skin:  Skin is warm, dry and intact. No rash noted. Psychiatric: Mood and affect are normal.   ____________________________________________   LABS (all labs ordered are listed, but only abnormal results are displayed)  Labs Reviewed  COMPREHENSIVE METABOLIC PANEL - Abnormal; Notable for the following:    Glucose, Bld 127 (*)    All other components within normal limits  CBC - Abnormal; Notable for the following:    WBC  12.1 (*)    Platelets 146 (*)    All other components within normal limits  CULTURE, BLOOD (ROUTINE X 2)  CULTURE, BLOOD (ROUTINE X 2)  MONONUCLEOSIS SCREEN   ____________________________________________   RADIOLOGY  Diffuse inflammation of the right palatine tonsil. Within the enlarged inflamed right palatine tonsil, there is a 1.2 x 1 x 1 cm low-density  component suggestive of contained abscess. Breakthrough of inflammatory process with inflammation in the right parapharyngeal space and retropharyngeal space/ prevertebral space. Streak artifacts slightly limits evaluation however, the inflammatory component within the right parapharyngeal space and retropharyngeal space currently does not have well-defined borders to suggest well-defined drainable abscess in this region.  The inflammatory process extends to level of the epiglottis (which is slightly thickened) and the right aryepiglottic region. Underfilling of the right piriform sinus secondary to surrounding inflammation.  Diffuse adenopathy, greater right neck and most prominent in the level 2 region.  These results were called by telephone at the time of interpretation on 03/21/2016 at 11:52 am to Dlisa Barnwell, emergency room physician's assistant, who verbally acknowledged these results.   Electronically Signed By: Lacy Duverney M.D. On: 03/21/2016 12:04 ____________________________________________   PROCEDURES  Procedure(s) performed: None  Critical Care performed: No  ____________________________________________   INITIAL IMPRESSION / ASSESSMENT AND PLAN / ED COURSE  Pertinent labs & imaging results that were available during my care of the patient were reviewed by me and considered in my medical decision making (see chart for details).  Patient was given Solumedrol with relief of trismus. Blood cultures were obtained and Unasyn was started. He was given Morphine  and Ondansetron  with moderate relief of pain.   CT results discussed with Dr. Jenne Campus. Plan to discuss with hospitalist and and admit to medicine for ENT to follow in consult.   Discussed patient with Dr. Renae Gloss who agrees to admit.  ____________________________________________   FINAL CLINICAL IMPRESSION(S) / ED DIAGNOSES  Final diagnoses:  Tonsillitis  Cellulitis and abscess of oral  soft tissues     Chinita Pester, FNP 03/21/16 1600  Jennye Moccasin, MD 03/23/16 1455

## 2016-03-21 NOTE — Consult Note (Signed)
Derek Murphy, Derek Murphy 1991/01/21 Derek HammingSnehalatha Konidena, MD  Reason for Consult: Sore throat and small peritonsillar abscess  HPI: 25 year old otherwise healthy male with 24-hour history of sore throat. Was seen in the emergency room yesterday and was diagnosed with a viral for jaundice and discharged home with outpatient services as a steroid taper. Return to the emergency room today with worsening sore throat. His voice is normal he has no difficulty handling his own secretions and only complains of a sore throat.  Allergies: No Known Allergies  ROS: Review of systems normal other than 12 systems except per HPI.  PMH: History reviewed. No pertinent past medical history.  FH: History reviewed. No pertinent family history.  SH:  Social History   Social History  . Marital Status: Single    Spouse Name: N/A  . Number of Children: N/A  . Years of Education: N/A   Occupational History  . Not on file.   Social History Main Topics  . Smoking status: Never Smoker   . Smokeless tobacco: Not on file  . Alcohol Use: Yes     Comment: occasional  . Drug Use: No  . Sexual Activity: Not on file   Other Topics Concern  . Not on file   Social History Narrative    PSH: History reviewed. No pertinent past surgical history.  Physical  Exam:  CN 2-12 grossly intact and symmetric. EAC/TMs normal BL. Oral cavity oropharynx there is no evidence of trismus is mild edema of the uvula tonsillitis bilaterally worse on the right than the left. There is no significant shift no evidence clinically of a peritonsillar abscess below tonsillar palate. Skin warm and dry. Nasal cavity without polyps or purulence. External nose and ears without masses or lesions. EOMI, PERRLA. Neck supple with no masses or lesions. Tender adenopathy in the right upper neck with minimal lymphadenopathy. Thyroid normal with no masses.   Review of the CT scan is a very small 1 cm peritonsillar abscess with tonsillar edema  and surrounding soft tissue swelling. There is no airway compromise.  A/P: Tonsillitis with very small peritonsillar abscess. Clinically, Derek Murphy does not appear to be that ill handling his own secretions and no trismus. We discussed options including 24-hour observation on antibiotics and steroids versus incision and drainage of a small abscess which would be difficult to identify. The hospitalist team has agreed to admit him for overnight observation. I would recommend Unasyn 3 g IV every 6 hours and Decadron 10 mg IV every 8 hours. I will recommend he be nothing by mouth overnight in the event that his condition worsens and he needs to go to the operative room for incision and drainage.   Derek Murphy T 03/21/2016 1:55 PM

## 2016-03-22 LAB — CBC
HEMATOCRIT: 39.5 % — AB (ref 40.0–52.0)
HEMOGLOBIN: 13.7 g/dL (ref 13.0–18.0)
MCH: 31.1 pg (ref 26.0–34.0)
MCHC: 34.6 g/dL (ref 32.0–36.0)
MCV: 89.9 fL (ref 80.0–100.0)
Platelets: 157 10*3/uL (ref 150–440)
RBC: 4.4 MIL/uL (ref 4.40–5.90)
RDW: 13.4 % (ref 11.5–14.5)
WBC: 12.1 10*3/uL — AB (ref 3.8–10.6)

## 2016-03-22 LAB — GLUCOSE, CAPILLARY: Glucose-Capillary: 119 mg/dL — ABNORMAL HIGH (ref 65–99)

## 2016-03-22 NOTE — Care Management (Signed)
Patient admitted with peritonsillar abscess.  Patient listed at self pay patient.  Patient states that he is employed, however is uninsured.  Patient lives at home with his father.  Does not have a PCP.  Denies any transportation of financial concerns.  Provided patient with application for [facility] and [facility].  RNCM following for discharge medication needs.

## 2016-03-22 NOTE — Progress Notes (Signed)
[facility] [place] [facility]   PATIENT NAME: Derek Murphy    MR#:  [id number]  DATE OF BIRTH:  August 01, 1991  SUBJECTIVE:   Patient has some pain in the right side of the throat. He is tolerating his diet and is not complaining of wheezing. Pain has improved since admission.  REVIEW OF SYSTEMS:    Review of Systems  Constitutional: Negative for fever, chills and malaise/fatigue.  HENT: Positive for sore throat. Negative for ear discharge, ear pain, hearing loss and nosebleeds.   Eyes: Negative for blurred vision and pain.  Respiratory: Negative for cough, hemoptysis, shortness of breath and wheezing.   Cardiovascular: Negative for chest pain, palpitations and leg swelling.  Gastrointestinal: Negative for nausea, vomiting, abdominal pain, diarrhea and blood in stool.  Genitourinary: Negative for dysuria.  Musculoskeletal: Negative for back pain.  Neurological: Negative for dizziness, tremors, speech change, focal weakness, seizures and headaches.  Endo/Heme/Allergies: Does not bruise/bleed easily.  Psychiatric/Behavioral: Negative for depression, suicidal ideas and hallucinations.    Tolerating Diet:Yes      DRUG ALLERGIES:  No Known Allergies  VITALS:  Blood pressure 129/70, pulse 64, temperature 98 F (36.7 C), temperature source Oral, resp. rate 18, height 5\' 3"  (1.6 m), weight 92.488 kg (203 lb 14.4 oz), SpO2 100 %.  PHYSICAL EXAMINATION:   Physical Exam  Constitutional: He is oriented to person, place, and time and well-developed, well-nourished, and in no distress. No distress.  HENT:  Head: Normocephalic.  Eyes: No scleral icterus.  Neck: Normal range of motion. Neck supple. No JVD present. No tracheal deviation present.  Cardiovascular: Normal rate, regular rhythm and normal heart sounds.  Exam reveals no gallop and no friction rub.   No murmur heard. Pulmonary/Chest: Effort normal and breath sounds normal. No stridor. No respiratory  distress. He has no wheezes. He has no rales. He exhibits no tenderness.  Abdominal: Soft. Bowel sounds are normal. He exhibits no distension and no mass. There is no tenderness. There is no rebound and no guarding.  Musculoskeletal: Normal range of motion. He exhibits no edema.  Lymphadenopathy:    He has cervical adenopathy.  Neurological: He is alert and oriented to person, place, and time.  Skin: Skin is warm. No rash noted. No erythema.  Psychiatric: Affect and judgment normal.      LABORATORY PANEL:   CBC  Recent Labs Lab 03/22/16 0459  WBC 12.1*  HGB 13.7  HCT 39.5*  PLT 157   ------------------------------------------------------------------------------------------------------------------  Chemistries   Recent Labs Lab 03/21/16 0853  NA 140  K 3.6  CL 104  CO2 26  GLUCOSE 127*  BUN 16  CREATININE 1.11  CALCIUM 9.1  AST 27  ALT 23  ALKPHOS 53  BILITOT 0.7   ------------------------------------------------------------------------------------------------------------------  Cardiac Enzymes No results for input(s): TROPONINI in the last 168 hours. ------------------------------------------------------------------------------------------------------------------  RADIOLOGY:  Ct Soft Tissue Neck W Contrast  03/21/2016  CLINICAL DATA:  25 year old EXAM: CT NECK WITH CONTRAST TECHNIQUE: Multidetector CT imaging of the neck was performed using the standard protocol following the bolus administration of intravenous contrast. CONTRAST:  75mL ISOVUE-300 IOPAMIDOL (ISOVUE-300) INJECTION 61% COMPARISON:  None. FINDINGS: Pharynx and larynx: Diffuse inflammation of the right palatine tonsil. Within the enlarged inflamed right palatine tonsil, there is a 1.2 x 1 x 1 cm low-density component suggestive of contained abscess. Breakthrough of inflammatory process with inflammation in the right parapharyngeal space and retropharyngeal space/ prevertebral space. Streak artifacts  slightly limits evaluation however, the inflammatory component  within the right parapharyngeal space and retropharyngeal space currently does not have well-defined borders to suggest well-defined drainable abscess in this region. Patient is at risk for development of deep abscess. The inflammatory process extends to level of the epiglottis (which is slightly thickened) and the right aryepiglottic region. Underfilling of the right piriform sinus secondary to surrounding inflammation. Salivary glands: No primary salivary gland abnormality. Thyroid: No primary thyroid abnormality. Lymph nodes: Diffuse adenopathy, greater right neck and most prominent in the level 2 region. Vascular: No thrombosis of the internal jugular vein. Limited intracranial: Negative. Visualized orbits: Only partially imaged and unremarkable. Mastoids and visualized paranasal sinuses: Probable retention cyst inferior left maxillary sinus. Skeleton: Lordosis of the cervical spine may be related to splinting. No obvious epidural extension of inflammatory process. Upper chest: Clear. IMPRESSION: Diffuse inflammation of the right palatine tonsil. Within the enlarged inflamed right palatine tonsil, there is a 1.2 x 1 x 1 cm low-density component suggestive of contained abscess. Breakthrough of inflammatory process with inflammation in the right parapharyngeal space and retropharyngeal space/ prevertebral space. Streak artifacts slightly limits evaluation however, the inflammatory component within the right parapharyngeal space and retropharyngeal space currently does not have well-defined borders to suggest well-defined drainable abscess in this region. The inflammatory process extends to level of the epiglottis (which is slightly thickened) and the right aryepiglottic region. Underfilling of the right piriform sinus secondary to surrounding inflammation. Diffuse adenopathy, greater right neck and most prominent in the level 2 region. These results  were called by telephone at the time of interpretation on 03/21/2016 at 11:52 am to CARI TRIPLETT, emergency room physician's assistant, who verbally acknowledged these results. Electronically Signed   By: Lacy Duverney M.D.   On: 03/21/2016 12:04     ASSESSMENT AND PLAN:   25 year old male who presents with tonsillitis and small peritonsillar abscess.  1. Tonsillitis with small. Tonsil abscess: Continue Unasyn and Decadron. Appreciate ENT evaluation. Patient will be able to be discharged home on Augmentin and amoxicillin for total of 14 days with a 12 day double strength steroid taper. Patient will need ENT follow-up next week.  2. Leukocytosis: White blood cell count remains at 12. We'll repeat CBC in a.m.      Management plans discussed with the patient and he is in agreement.  CODE STATUS: full  TOTAL TIME TAKING CARE OF THIS PATIENT: 30 minutes.     POSSIBLE D/C tomorrow, DEPENDING ON CLINICAL CONDITION.   Tavone Caesar M.D on 03/22/2016 at 11:34 AM  Between 7am to 6pm - Pager - [phone] After 6pm go to [email or web address] - Social research officer, government  [facility]  Office  [phone]  CC: Primary care physician; No PCP Per Patient  Note: This dictation was prepared with Dragon dictation along with smaller phrase technology. Any transcriptional errors that result from this process are unintentional.

## 2016-03-22 NOTE — Progress Notes (Signed)
03/22/2016 7:58 AM  Melinda Crutchrake, Vinicio [id number]  Hospital day 1-Feeling better today.  Pain much improved.  Hungry    Temp:  [98 F (36.7 C)-99.1 F (37.3 C)] 98 F (36.7 C) (04/26 0447) Pulse Rate:  [64-93] 64 (04/26 0447) Resp:  [16-22] 18 (04/26 0447) BP: (120-140)/(70-90) 129/70 mmHg (04/26 0447) SpO2:  [96 %-100 %] 100 % (04/26 0447) Weight:  [90.719 kg (200 lb)-92.488 kg (203 lb 14.4 oz)] 92.488 kg (203 lb 14.4 oz) (04/26 0500),     Intake/Output Summary (Last 24 hours) at 03/22/16 0758 Last data filed at 03/22/16 0609  Gross per 24 hour  Intake 1683.33 ml  Output   1000 ml  Net 683.33 ml    Results for orders placed or performed during the hospital encounter of 03/21/16 (from the past 24 hour(s))  Comprehensive metabolic panel     Status: Abnormal   Collection Time: 03/21/16  8:53 AM  Result Value Ref Range   Sodium 140 135 - 145 mmol/L   Potassium 3.6 3.5 - 5.1 mmol/L   Chloride 104 101 - 111 mmol/L   CO2 26 22 - 32 mmol/L   Glucose, Bld 127 (H) 65 - 99 mg/dL   BUN 16 6 - 20 mg/dL   Creatinine, Ser 4.091.11 0.61 - 1.24 mg/dL   Calcium 9.1 8.9 - 81.110.3 mg/dL   Total Protein 7.5 6.5 - 8.1 g/dL   Albumin 4.6 3.5 - 5.0 g/dL   AST 27 15 - 41 U/L   ALT 23 17 - 63 U/L   Alkaline Phosphatase 53 38 - 126 U/L   Total Bilirubin 0.7 0.3 - 1.2 mg/dL   GFR calc non Af Amer >60 >60 mL/min   GFR calc Af Amer >60 >60 mL/min   Anion gap 10 5 - 15  CBC     Status: Abnormal   Collection Time: 03/21/16  8:53 AM  Result Value Ref Range   WBC 12.1 (H) 3.8 - 10.6 K/uL   RBC 4.63 4.40 - 5.90 MIL/uL   Hemoglobin 14.2 13.0 - 18.0 g/dL   HCT 91.441.2 78.240.0 - 95.652.0 %   MCV 89.0 80.0 - 100.0 fL   MCH 30.6 26.0 - 34.0 pg   MCHC 34.4 32.0 - 36.0 g/dL   RDW 21.313.6 08.611.5 - 57.814.5 %   Platelets 146 (L) 150 - 440 K/uL  Mononucleosis screen     Status: None   Collection Time: 03/21/16  8:53 AM  Result Value Ref Range   Mono Screen NEGATIVE NEGATIVE  Culture, blood (routine x 2)     Status: None  (Preliminary result)   Collection Time: 03/21/16 12:36 PM  Result Value Ref Range   Specimen Description BLOOD RIGHT ASSIST CONTROL    Special Requests BOTTLES DRAWN AEROBIC AND ANAEROBIC 8CC    Culture NO GROWTH <12 HOURS    Report Status PENDING   Culture, blood (routine x 2)     Status: None (Preliminary result)   Collection Time: 03/21/16 12:36 PM  Result Value Ref Range   Specimen Description BLOOD LEFT ASSIST CONTROL    Special Requests      BOTTLES DRAWN AEROBIC AND ANAEROBIC 9CC AERO 7CC ANA   Culture NO GROWTH <12 HOURS    Report Status PENDING   CBC     Status: Abnormal   Collection Time: 03/22/16  4:59 AM  Result Value Ref Range   WBC 12.1 (H) 3.8 - 10.6 K/uL   RBC 4.40 4.40 - 5.90 MIL/uL  Hemoglobin 13.7 13.0 - 18.0 g/dL   HCT 40.9 (L) 81.1 - 91.4 %   MCV 89.9 80.0 - 100.0 fL   MCH 31.1 26.0 - 34.0 pg   MCHC 34.6 32.0 - 36.0 g/dL   RDW 78.2 95.6 - 21.3 %   Platelets 157 150 - 440 K/uL    SUBJECTIVE:  Feeling much better, pain nearly resolved  OBJECTIVE:  No trismus, voice normal, tonsiller swelling decreased, lymphadenopathy improved  IMPRESSION:  Tonsillitis with small PTA appears resolving  PLAN:  Reg diet today, recommend another 24hrs IV unasyn/decadron then DC to home on Augmenting  bid AND Amoxil 500 tid  For 14 days with a 12 day double strength steripred taper.  Please schedule f/u with me next week.  Patient aware that if symptoms return after DC he is to contact our office.    Jeffie Widdowson T 03/22/2016, 7:58 AM

## 2016-03-22 NOTE — Progress Notes (Signed)
Resumed care of pt at 1600. Pt resting in bed, no C/O pain. Will continue to monitor and meet needs.

## 2016-03-23 LAB — CBC
HEMATOCRIT: 37 % — AB (ref 40.0–52.0)
Hemoglobin: 12.9 g/dL — ABNORMAL LOW (ref 13.0–18.0)
MCH: 31 pg (ref 26.0–34.0)
MCHC: 34.8 g/dL (ref 32.0–36.0)
MCV: 89.2 fL (ref 80.0–100.0)
Platelets: 147 10*3/uL — ABNORMAL LOW (ref 150–440)
RBC: 4.15 MIL/uL — ABNORMAL LOW (ref 4.40–5.90)
RDW: 13.6 % (ref 11.5–14.5)
WBC: 8.6 10*3/uL (ref 3.8–10.6)

## 2016-03-23 LAB — GLUCOSE, CAPILLARY: Glucose-Capillary: 129 mg/dL — ABNORMAL HIGH (ref 65–99)

## 2016-03-23 MED ORDER — DOCUSATE SODIUM 100 MG PO CAPS: 100.0000 mg | ORAL_CAPSULE | Freq: Two times a day (BID) | ORAL | Status: DC | PRN

## 2016-03-23 MED ORDER — ACETAMINOPHEN 325 MG PO TABS: 650.0000 mg | ORAL_TABLET | Freq: Four times a day (QID) | ORAL | Status: DC | PRN

## 2016-03-23 MED ORDER — PREDNISONE 10 MG (21) PO TBPK: 10.0000 mg | ORAL_TABLET | Freq: Every day | ORAL | Status: DC

## 2016-03-23 MED ORDER — AMOXICILLIN-POT CLAVULANATE 875-125 MG PO TABS: 1.0000 | ORAL_TABLET | Freq: Two times a day (BID) | ORAL | Status: DC

## 2016-03-23 MED ORDER — AMOXICILLIN 500 MG PO CAPS: 500.0000 mg | ORAL_CAPSULE | Freq: Three times a day (TID) | ORAL | Status: AC

## 2016-03-23 NOTE — Progress Notes (Signed)
Pt stable. IV removed. D/c instructions given and education provided. Prescriptions verified and given. Pt states he understands instructions. Pt dressed and escorted out by staff. Driven home by family.  

## 2016-03-23 NOTE — Care Management (Signed)
Patient to be discharged on 2 PO antibiotics and PO steroids. [facility] out of pocket cost with coupons from [employer] $48.90, out of cost from [employer] $67.09.  I have provided that coupons to the patient and he is going check with his father to see if he will provide him with the funds to obtains his medications at discharge.

## 2016-03-23 NOTE — Discharge Instructions (Signed)
Activity as tolerated Follow-up with ENT as scheduled on 5/3  Regular diet as tolerated

## 2016-03-23 NOTE — Discharge Summary (Signed)
[facility] [place] at [facility]   PATIENT NAME: Derek Murphy    MR#:  [id number]  DATE OF BIRTH:  09/23/1991  DATE OF ADMISSION:  03/21/2016 ADMITTING PHYSICIAN: [name], MD  DATE OF DISCHARGE: 03/23/16 PRIMARY CARE PHYSICIAN: No PCP Per Patient    ADMISSION DIAGNOSIS:  Cellulitis and abscess of oral soft tissues [K12.2] Tonsillitis [J03.90]  DISCHARGE DIAGNOSIS:  Active Problems:   Tonsil, abscess   SECONDARY DIAGNOSIS:  History reviewed. No pertinent past medical history.  HOSPITAL COURSE:   25 year old male who presents with tonsillitis and small peritonsillar abscess.  1. Tonsillitis with small tonsillar abscess: Given  Unasyn and Decadron during hospital course. Clinically feeling better. Okay to discharge the patient from ENT standpoint, discussed with Dr. Jenne CampusMcQueen today Denies any throat pain or dysphagia Appreciate ENT evaluation. Outpatient follow-up with ENT in a week Patient will be  discharged home on Augmentin and amoxicillin for total of 14 days with a 12 day double strength steroid taper.   2. Leukocytosis: Clinically improving. White blood cell count trending down at 12.-8.6   DISCHARGE CONDITIONS:   Fair  CONSULTS OBTAINED:   ENT   PROCEDURES none  DRUG ALLERGIES:  No Known Allergies  DISCHARGE MEDICATIONS:   Current Discharge Medication List    START taking these medications   Details  acetaminophen (TYLENOL) 325 MG tablet Take 2 tablets (650 mg total) by mouth every 6 (six) hours as needed for mild pain (or Fever >/= 101).    amoxicillin (AMOXIL) 500 MG capsule Take 1 capsule (500 mg total) by mouth 3 (three) times daily. Qty: 42 capsule, Refills: 0    amoxicillin-clavulanate (AUGMENTIN) 875-125 MG tablet Take 1 tablet by mouth 2 (two) times daily. Qty: 28 tablet, Refills: 0    docusate sodium (COLACE) 100 MG capsule Take 1 capsule (100 mg total) by mouth 2 (two) times daily as needed for mild  constipation. Qty: 10 capsule, Refills: 0    predniSONE (STERAPRED UNI-PAK 21 TAB) 10 MG (21) TBPK tablet Take 1 tablet (10 mg total) by mouth daily. Take 6 tablets by mouth for 2 days followed by  5 tablets by mouth for 2 days followed by  4 tablets by mouth for 2 days followed by  3 tablets by mouth for 2 days followed by  2 tablets by mouth for 2 days followed by  1 tablet by mouth for 2 days and stop Qty: 45 tablet, Refills: 0         DISCHARGE INSTRUCTIONS:   Activity as tolerated Follow-up with ENT as scheduled on 5/3   DIET:  Regular diet  DISCHARGE CONDITION:  Fair  ACTIVITY:  Activity as tolerated  OXYGEN:  Home Oxygen: No.   Oxygen Delivery: room air  DISCHARGE LOCATION:  home   If you experience worsening of your admission symptoms, develop shortness of breath, life threatening emergency, suicidal or homicidal thoughts you must seek medical attention immediately by calling 911 or calling your MD immediately  if symptoms less severe.  You Must read complete instructions/literature along with all the possible adverse reactions/side effects for all the Medicines you take and that have been prescribed to you. Take any new Medicines after you have completely understood and accpet all the possible adverse reactions/side effects.   Please note  You were cared for by a hospitalist during your hospital stay. If you have any questions about your discharge medications or the care you received while you were in the hospital after  you are discharged, you can call the unit and asked to speak with the hospitalist on call if the hospitalist that took care of you is not available. Once you are discharged, your primary care physician will handle any further medical issues. Please note that NO REFILLS for any discharge medications will be authorized once you are discharged, as it is imperative that you return to your primary care physician (or establish a relationship with a  primary care physician if you do not have one) for your aftercare needs so that they can reassess your need for medications and monitor your lab values.     Today  Chief Complaint  Patient presents with  . Sore Throat  . Nausea   Patient is resting comfortably. Denies any throat pain. Her swallowing food without any difficulty. Denies any shortness of breath. Wants to go home  ROS:  CONSTITUTIONAL: Denies fevers, chills. Denies any fatigue, weakness.  EYES: Denies blurry vision, double vision, eye pain. EARS, NOSE, THROAT: Denies tinnitus, ear pain, hearing loss. RESPIRATORY: Denies cough, wheeze, shortness of breath.  CARDIOVASCULAR: Denies chest pain, palpitations, edema.  GASTROINTESTINAL: Denies nausea, vomiting, diarrhea, abdominal pain. Denies bright red blood per rectum. GENITOURINARY: Denies dysuria, hematuria. ENDOCRINE: Denies nocturia or thyroid problems. HEMATOLOGIC AND LYMPHATIC: Denies easy bruising or bleeding. SKIN: Denies rash or lesion. MUSCULOSKELETAL: Denies pain in neck, back, shoulder, knees, hips or arthritic symptoms.  NEUROLOGIC: Denies paralysis, paresthesias.  PSYCHIATRIC: Denies anxiety or depressive symptoms.   VITAL SIGNS:  Blood pressure 107/55, pulse 74, temperature 98 F (36.7 C), temperature source Oral, resp. rate 18, height  (1.6 m), weight 98.431 kg (217 lb), SpO2 100 %.  I/O:    Intake/Output Summary (Last 24 hours) at 03/23/16 1252 Last data filed at 03/23/16 1012  Gross per 24 hour  Intake   2597 ml  Output   1750 ml  Net    847 ml    PHYSICAL EXAMINATION:  GENERAL:  25 y.o.-year-old patient lying in the bed with no acute distress.  EYES: Pupils equal, round, reactive to light and accommodation. No scleral icterus. Extraocular muscles intact.  HEENT: Head atraumatic, normocephalic. Oropharynx With mild erythema of tonsils and nasopharynx clear.  NECK:  Supple, no jugular venous distention. No thyroid enlargement, no  tenderness.  LUNGS: Normal breath sounds bilaterally, no wheezing, rales,rhonchi or crepitation. No use of accessory muscles of respiration.  CARDIOVASCULAR: S1, S2 normal. No murmurs, rubs, or gallops.  ABDOMEN: Soft, non-tender, non-distended. Bowel sounds present. No organomegaly or mass.  EXTREMITIES: No pedal edema, cyanosis, or clubbing.  NEUROLOGIC: Cranial nerves II through XII are intact. Muscle strength 5/5 in all extremities. Sensation intact. Gait not checked.  PSYCHIATRIC: The patient is alert and oriented x 3.  SKIN: No obvious rash, lesion, or ulcer.   DATA REVIEW:   CBC  Recent Labs Lab 03/23/16 0348  WBC 8.6  HGB 12.9*  HCT 37.0*  PLT 147*    Chemistries   Recent Labs Lab 03/21/16 0853  NA 140  K 3.6  CL 104  CO2 26  GLUCOSE 127*  BUN 16  CREATININE 1.11  CALCIUM 9.1  AST 27  ALT 23  ALKPHOS 53  BILITOT 0.7    Cardiac Enzymes No results for input(s): TROPONINI in the last 168 hours.  Microbiology Results  Results for orders placed or performed during the hospital encounter of 03/21/16  Culture, blood (routine x 2)     Status: None (Preliminary result)   Collection Time: 03/21/16  12:36 PM  Result Value Ref Range Status   Specimen Description BLOOD RIGHT ASSIST CONTROL  Final   Special Requests BOTTLES DRAWN AEROBIC AND ANAEROBIC 8CC  Final   Culture NO GROWTH 1 DAY  Final   Report Status PENDING  Incomplete  Culture, blood (routine x 2)     Status: None (Preliminary result)   Collection Time: 03/21/16 12:36 PM  Result Value Ref Range Status   Specimen Description BLOOD LEFT ASSIST CONTROL  Final   Special Requests   Final    BOTTLES DRAWN AEROBIC AND ANAEROBIC 9CC AERO 7CC ANA   Culture NO GROWTH 1 DAY  Final   Report Status PENDING  Incomplete    RADIOLOGY:  Dg Neck Soft Tissue  03/20/2016  CLINICAL DATA:  Sore throat and subjective right neck swelling. EXAM: NECK SOFT TISSUES - 1+ VIEW COMPARISON:  None. FINDINGS: Mild adenoid  thickening, commonly seen at this age in asymptomatic patients. There is no retropharyngeal swelling or epiglottic thickening. The airway is patent. No air column displacement. IMPRESSION: Negative study. Electronically Signed   By: Marnee Spring M.D.   On: 03/20/2016 05:07   Ct Soft Tissue Neck W Contrast  03/21/2016  CLINICAL DATA:  25 year old EXAM: CT NECK WITH CONTRAST TECHNIQUE: Multidetector CT imaging of the neck was performed using the standard protocol following the bolus administration of intravenous contrast. CONTRAST:  75mL ISOVUE-300 IOPAMIDOL (ISOVUE-300) INJECTION 61% COMPARISON:  None. FINDINGS: Pharynx and larynx: Diffuse inflammation of the right palatine tonsil. Within the enlarged inflamed right palatine tonsil, there is a 1.2 x 1 x 1 cm low-density component suggestive of contained abscess. Breakthrough of inflammatory process with inflammation in the right parapharyngeal space and retropharyngeal space/ prevertebral space. Streak artifacts slightly limits evaluation however, the inflammatory component within the right parapharyngeal space and retropharyngeal space currently does not have well-defined borders to suggest well-defined drainable abscess in this region. Patient is at risk for development of deep abscess. The inflammatory process extends to level of the epiglottis (which is slightly thickened) and the right aryepiglottic region. Underfilling of the right piriform sinus secondary to surrounding inflammation. Salivary glands: No primary salivary gland abnormality. Thyroid: No primary thyroid abnormality. Lymph nodes: Diffuse adenopathy, greater right neck and most prominent in the level 2 region. Vascular: No thrombosis of the internal jugular vein. Limited intracranial: Negative. Visualized orbits: Only partially imaged and unremarkable. Mastoids and visualized paranasal sinuses: Probable retention cyst inferior left maxillary sinus. Skeleton: Lordosis of the cervical spine may  be related to splinting. No obvious epidural extension of inflammatory process. Upper chest: Clear. IMPRESSION: Diffuse inflammation of the right palatine tonsil. Within the enlarged inflamed right palatine tonsil, there is a 1.2 x 1 x 1 cm low-density component suggestive of contained abscess. Breakthrough of inflammatory process with inflammation in the right parapharyngeal space and retropharyngeal space/ prevertebral space. Streak artifacts slightly limits evaluation however, the inflammatory component within the right parapharyngeal space and retropharyngeal space currently does not have well-defined borders to suggest well-defined drainable abscess in this region. The inflammatory process extends to level of the epiglottis (which is slightly thickened) and the right aryepiglottic region. Underfilling of the right piriform sinus secondary to surrounding inflammation. Diffuse adenopathy, greater right neck and most prominent in the level 2 region. These results were called by telephone at the time of interpretation on 03/21/2016 at 11:52 am to CARI TRIPLETT, emergency room physician's assistant, who verbally acknowledged these results. Electronically Signed   By: Ernie Hew.D.  On: 03/21/2016 12:04    EKG:   Orders placed or performed in visit on 09/08/14  . EKG 12-Lead      Management plans discussed with the patient, family and they are in agreement.  CODE STATUS:     Code Status Orders        Start     Ordered   03/21/16 1247  Full code   Continuous     03/21/16 1249    Code Status History    Date Active Date Inactive Code Status Order ID Comments User Context   This patient has a current code status but no historical code status.      TOTAL TIME TAKING CARE OF THIS PATIENT: 45  minutes.  Plan of care discussed with patient. He is agreeable  @[facility] @  on 03/23/2016 at 12:52 PM  Between 7am to 6pm - Pager - [phone]  After 6pm go to [email or web address] - password [email or web address]  [facility]  [facility]  Office  [phone]  CC: Primary care physician; No PCP Per Patient

## 2016-03-26 LAB — CULTURE, BLOOD (ROUTINE X 2)
CULTURE: NO GROWTH
CULTURE: NO GROWTH

## 2016-06-15 IMAGING — CR DG CHEST 2V
1 series · 2 of 2 positions shown · non-contrast
Comparison: September 09, 2014

CLINICAL DATA: Sharp right-sided chest pain for 1 day

EXAM:
CHEST  2 VIEW

[Series 1: dg chest 2 view · 0.14mm/px · 2 of 2 slices shown]
[im 1/2]
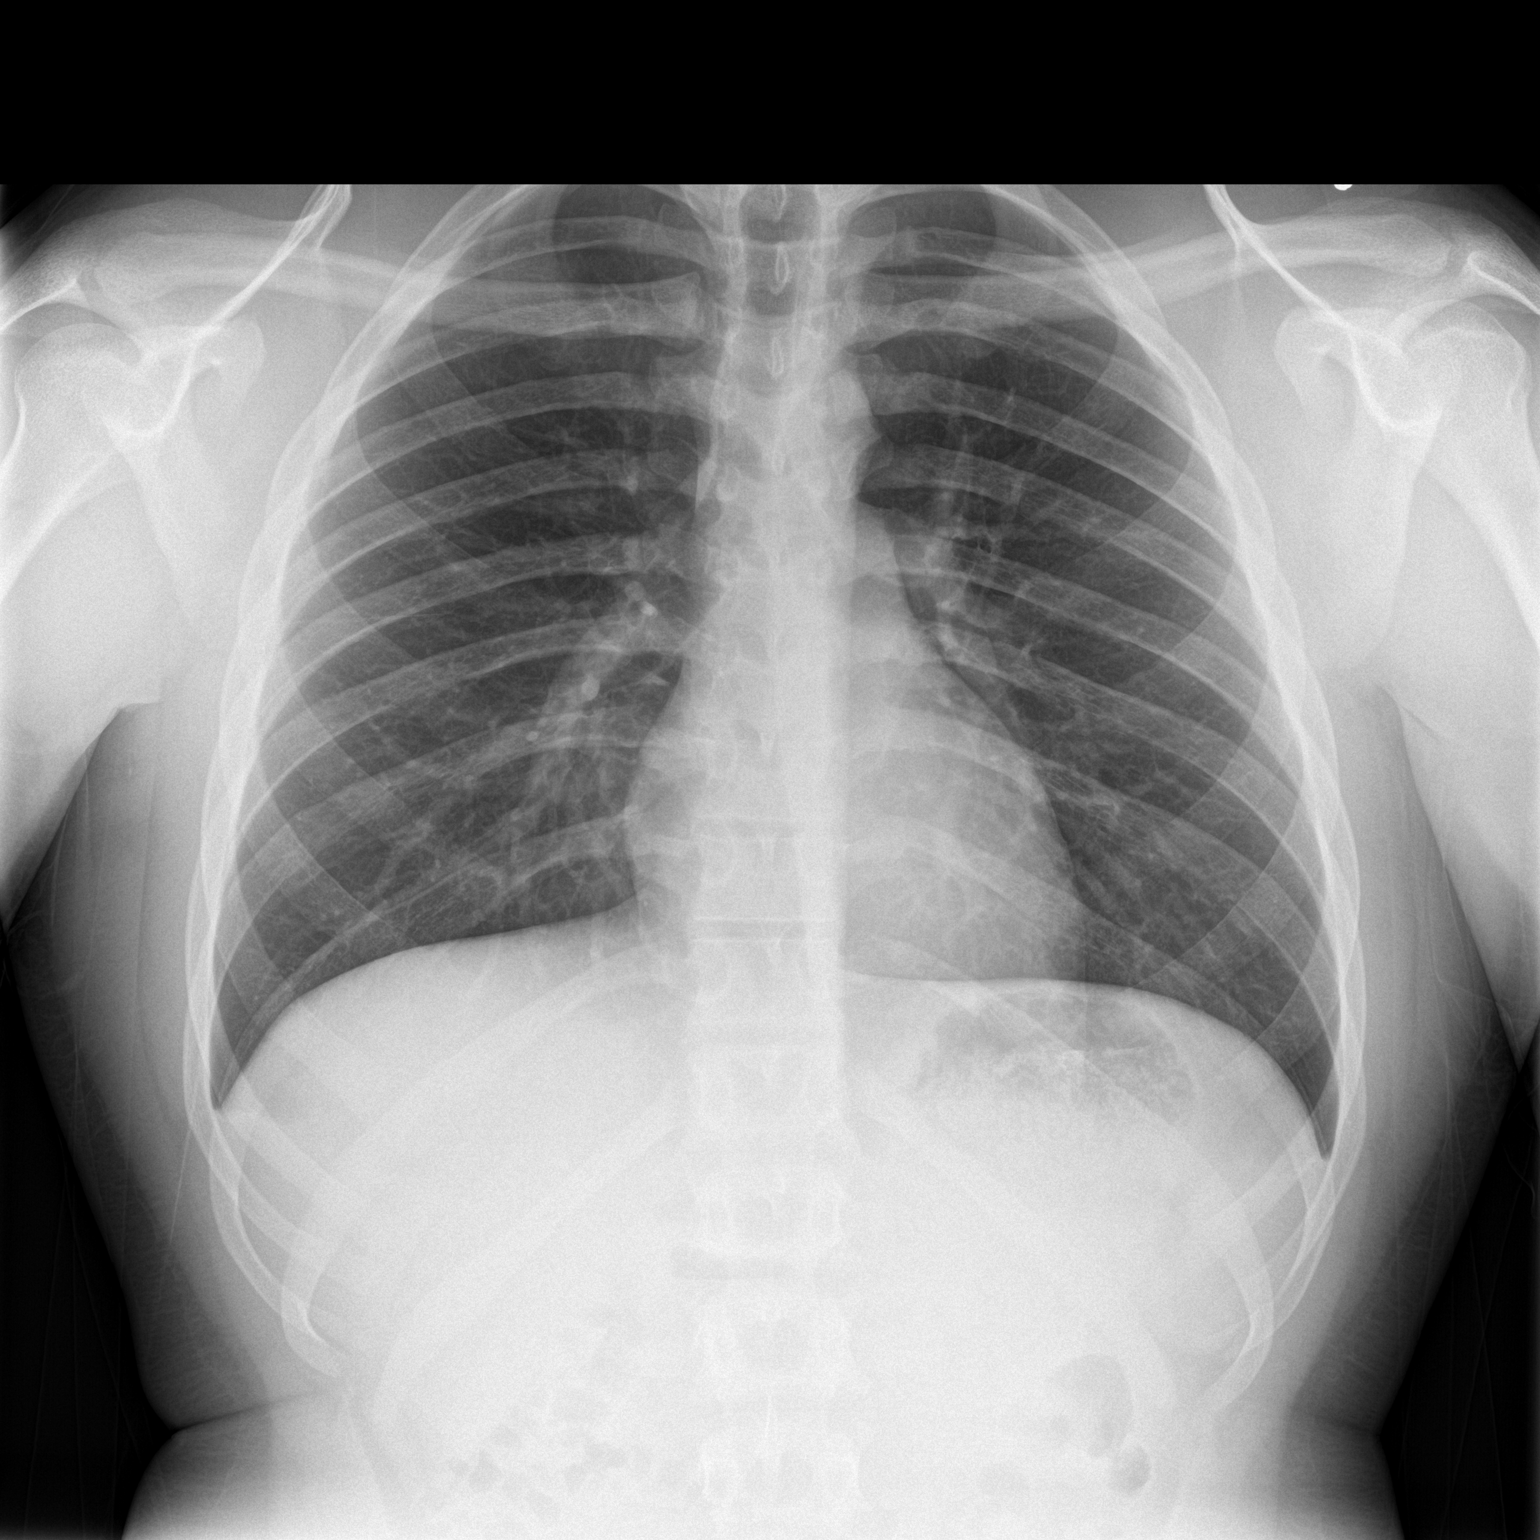
[im 2/2]
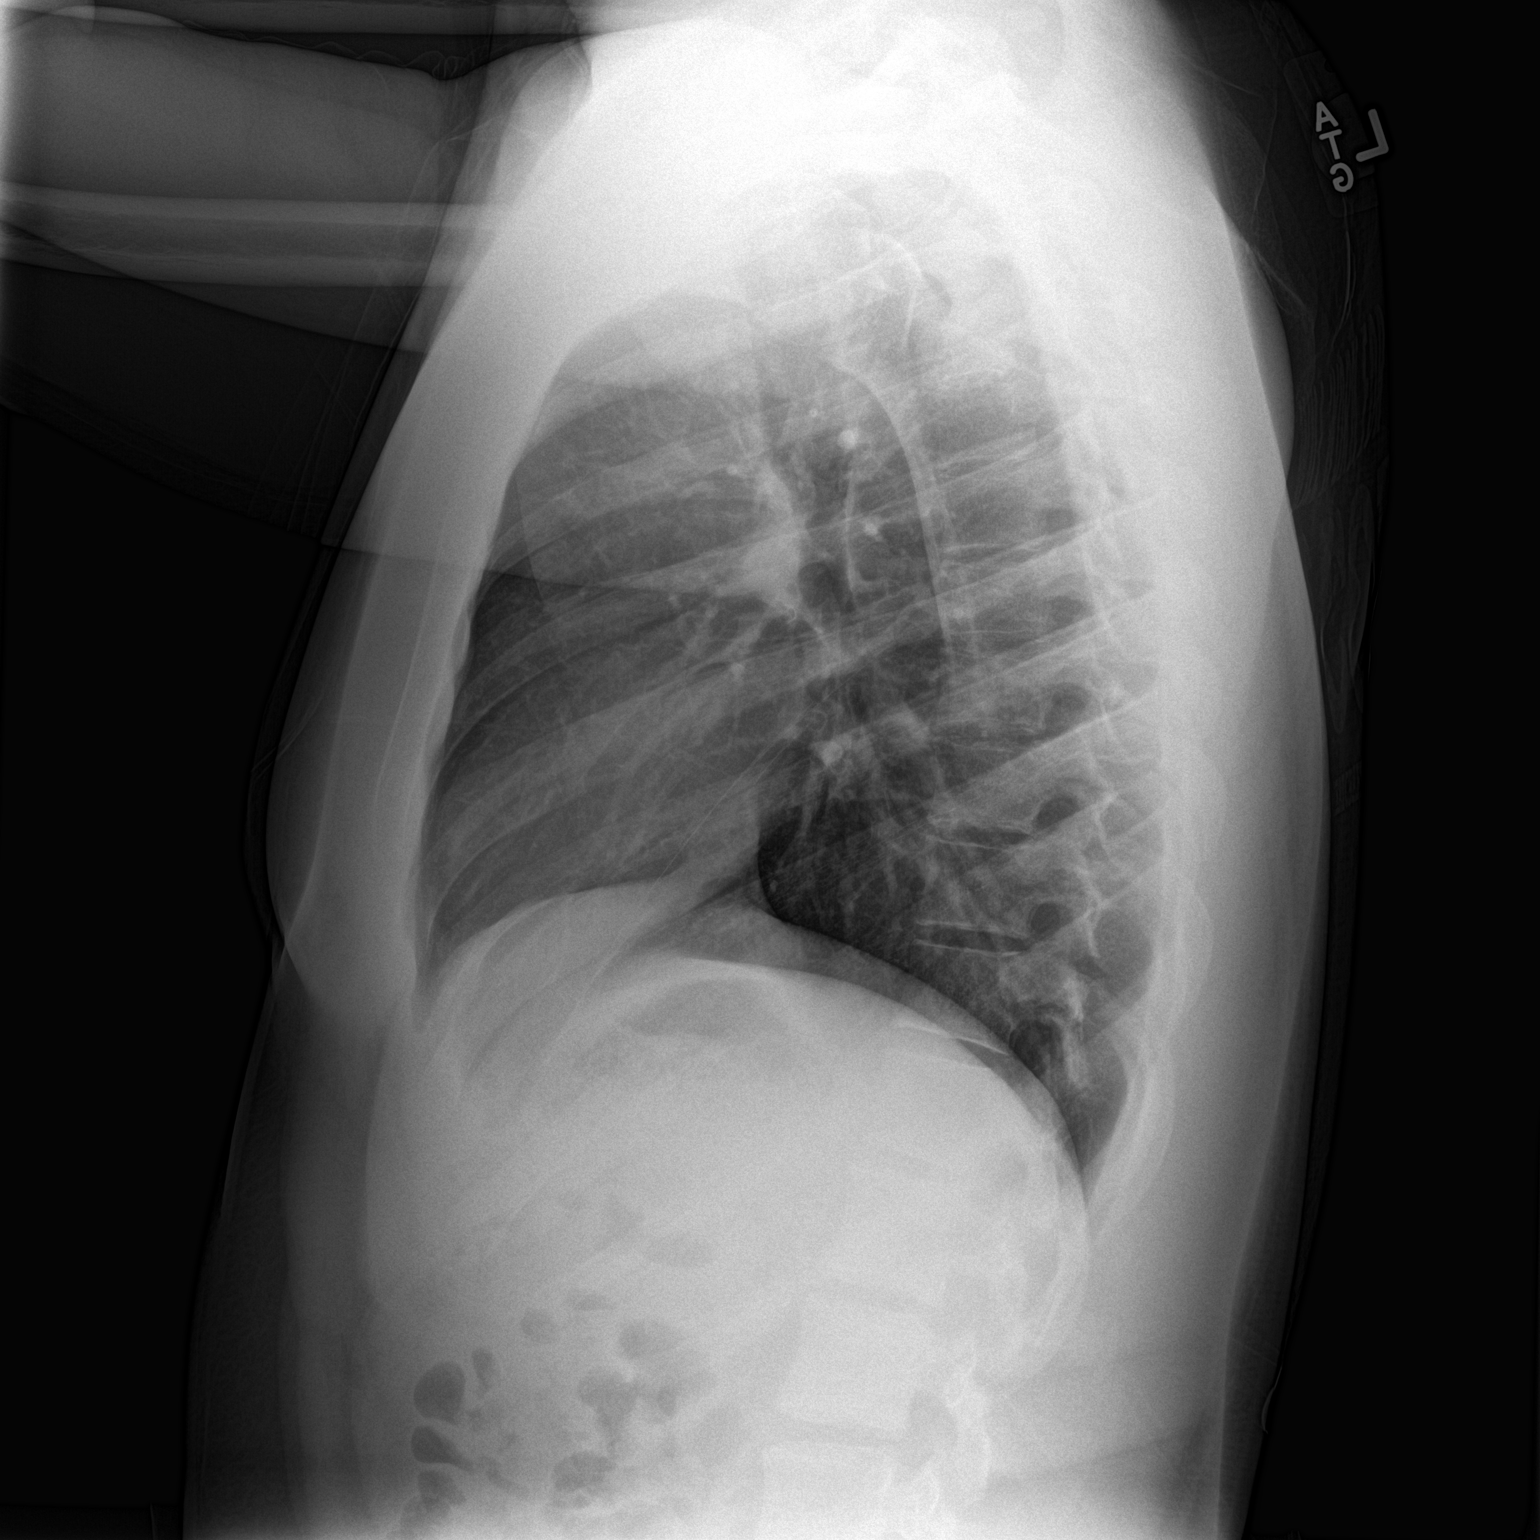

[2 of 2 positions shown; findings below may reference images not displayed]

FINDINGS: Lungs are clear. Heart size and pulmonary vascularity are normal. No
adenopathy. No pneumothorax. No bone lesions.
IMPRESSION: No abnormality noted.

## 2017-06-04 IMAGING — CR DG NECK SOFT TISSUE
2 series · 2 of 2 positions shown · non-contrast
Comparison: None.

CLINICAL DATA: Sore throat and subjective right neck swelling.

EXAM:
NECK SOFT TISSUES - 1+ VIEW

[neck lat]
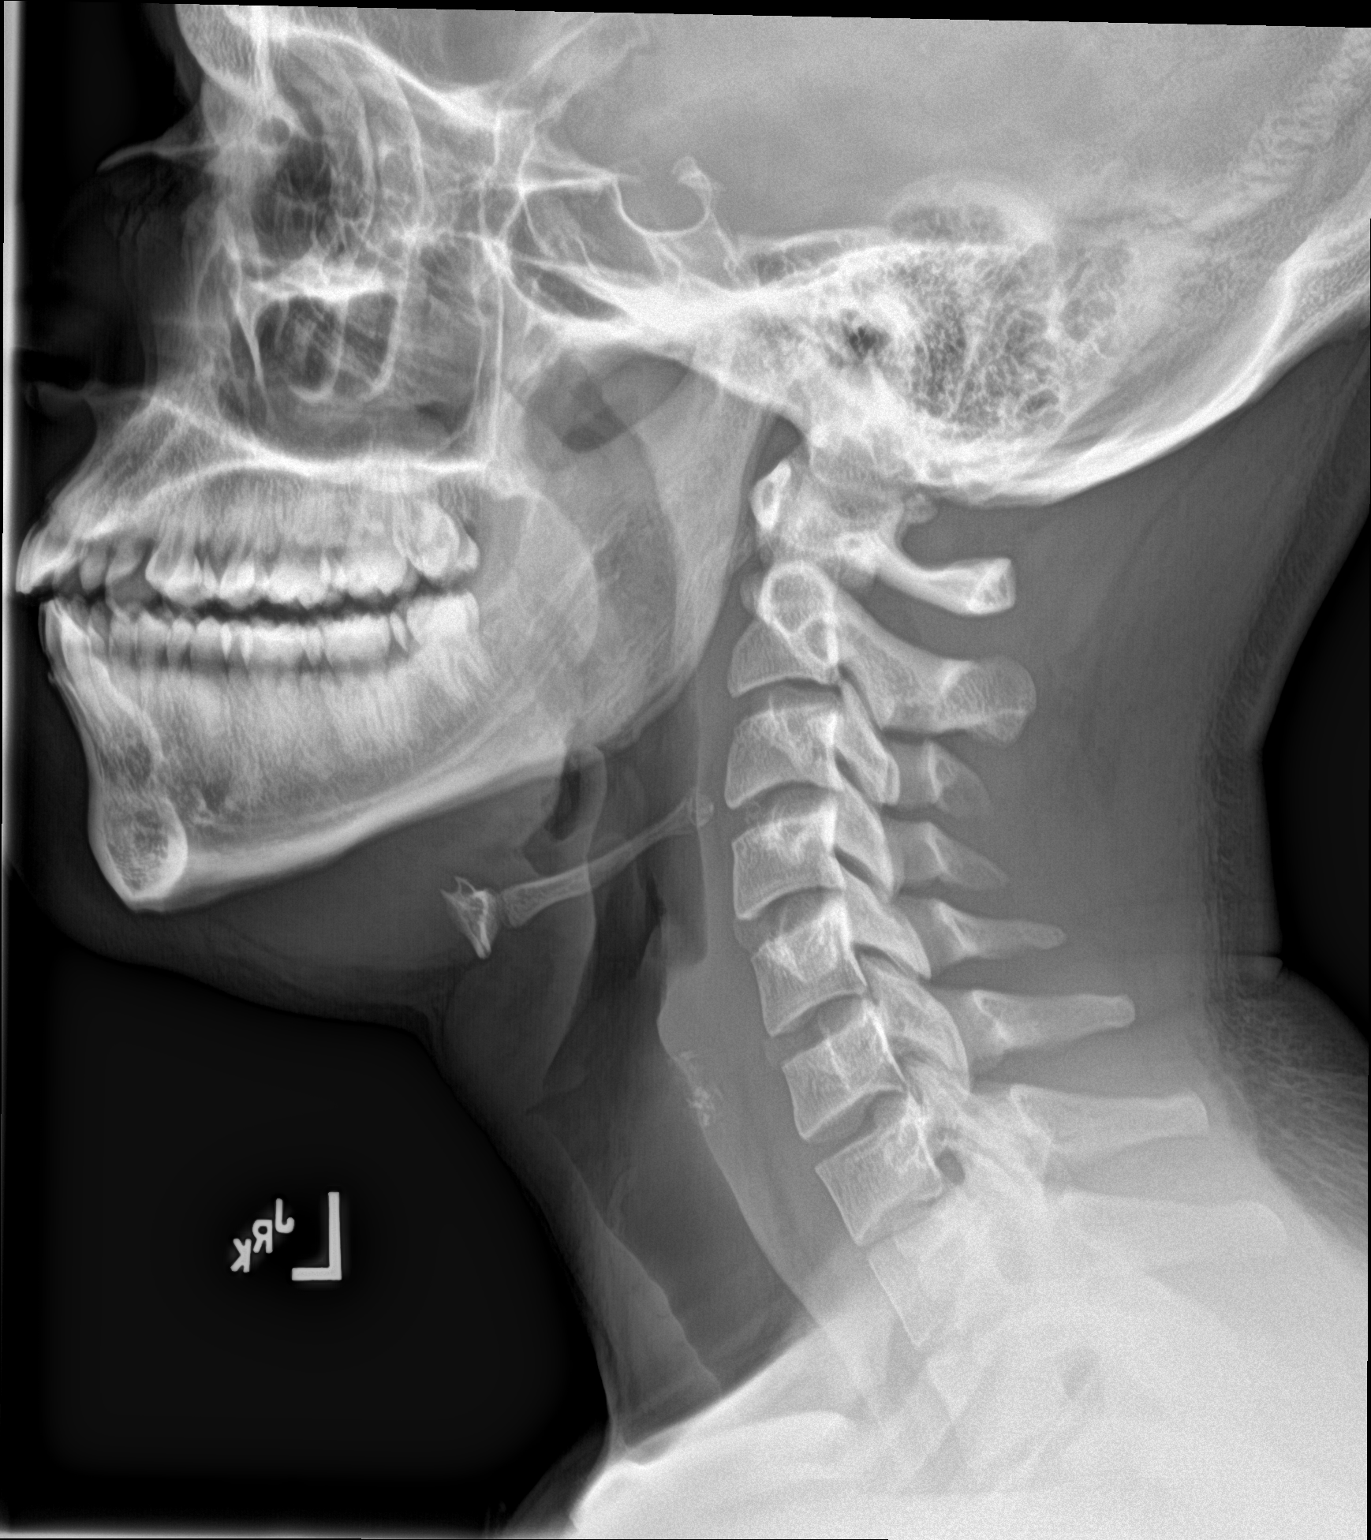

[neck ap]
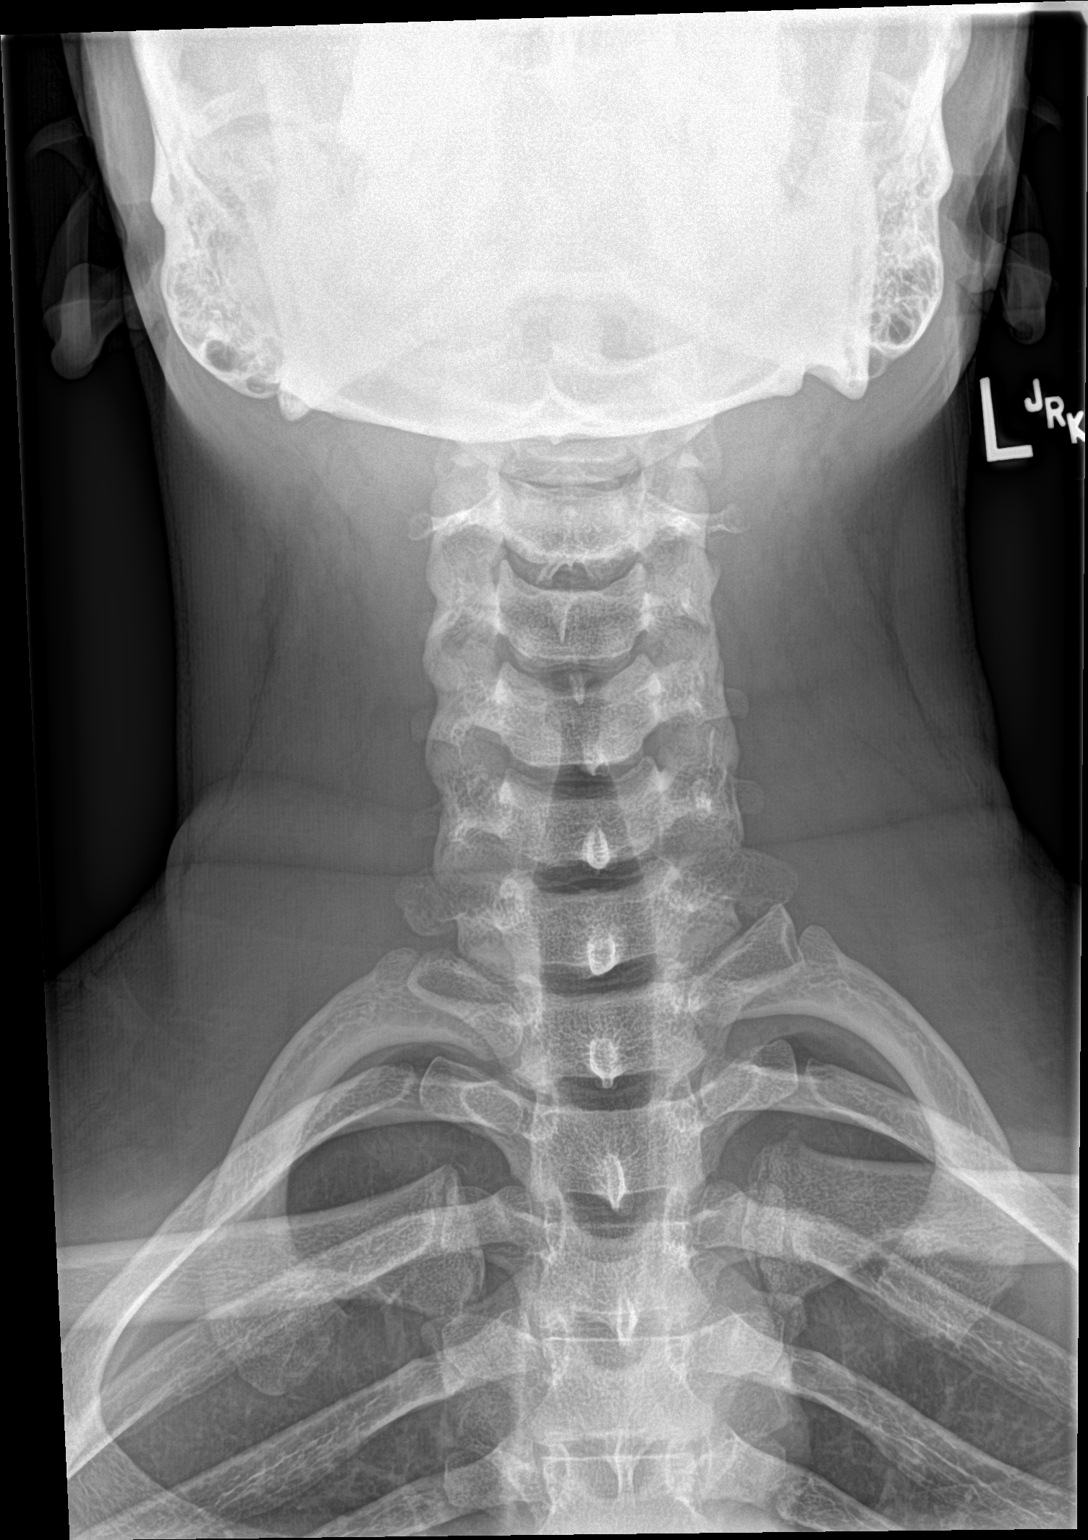

[2 of 2 positions shown; findings below may reference images not displayed]

FINDINGS: Mild adenoid thickening, commonly seen at this age in asymptomatic
patients. There is no retropharyngeal swelling or epiglottic
thickening. The airway is patent. No air column displacement.
IMPRESSION: Negative study.

## 2017-06-05 IMAGING — CT CT NECK W/ CM
3 of 5 series · 13 of 35 positions shown, 16 images · IV contrast (iopamidol)
Comparison: None.

CLINICAL DATA: 24-year-old

EXAM:
CT NECK WITH CONTRAST
TECHNIQUE: Multidetector CT imaging of the neck was performed using the
standard protocol following the bolus administration of intravenous
contrast.
CONTRAST:  75mL 1UOPYG-JBB IOPAMIDOL (1UOPYG-JBB) INJECTION 61%

[Series 4: sag neck · sagittal · 0.51mm/px · 5 of 148 slices shown, 6 images]
[im 50/148  bone]
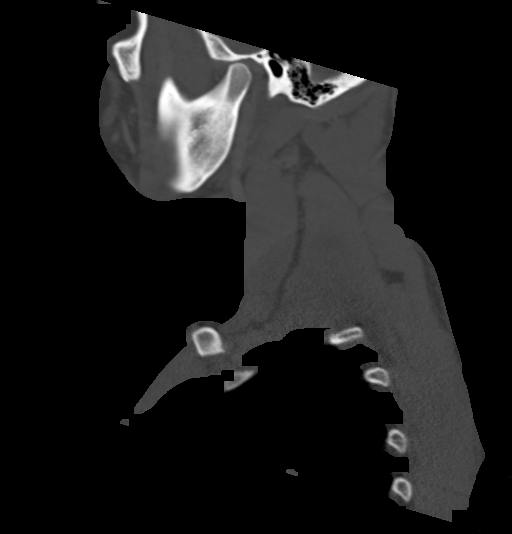
[im 62/148  bone]
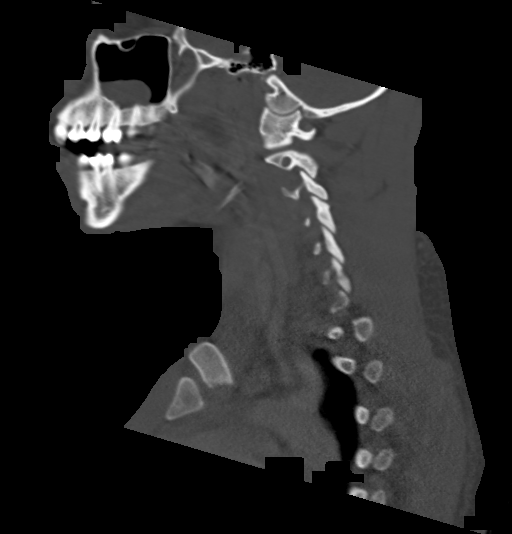
[im 74/148  soft-tissue]
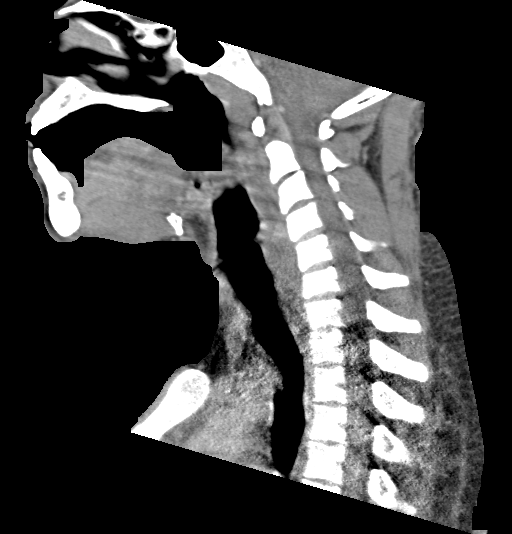
[im 74/148  bone]
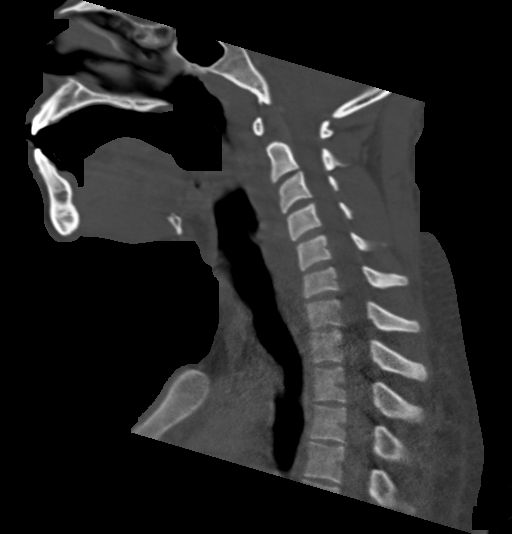
[im 86/148  bone]
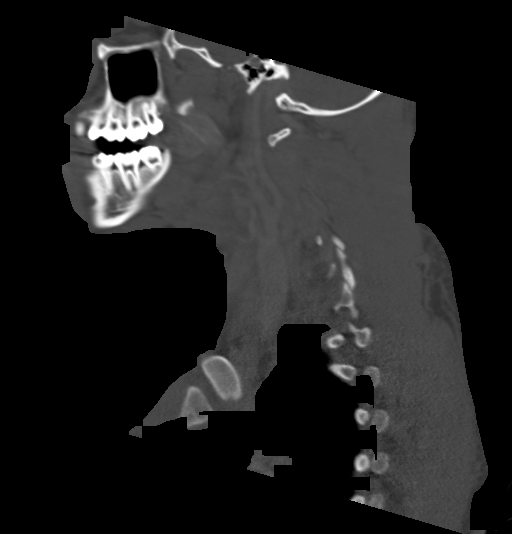
[im 99/148  bone]
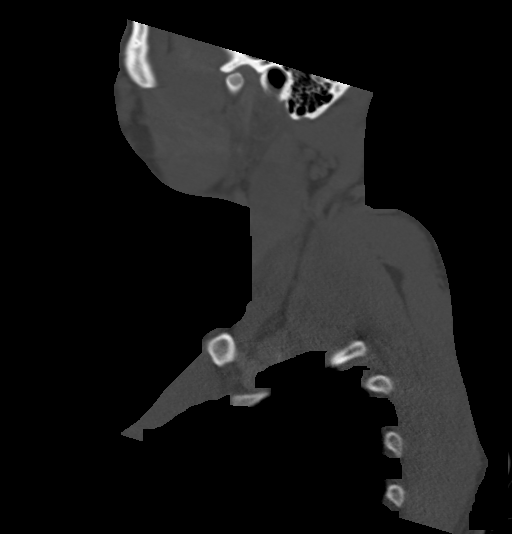

[Series 5: cor neck · coronal · 0.55mm/px · 3 of 120 slices shown]
[im 38/120  bone]
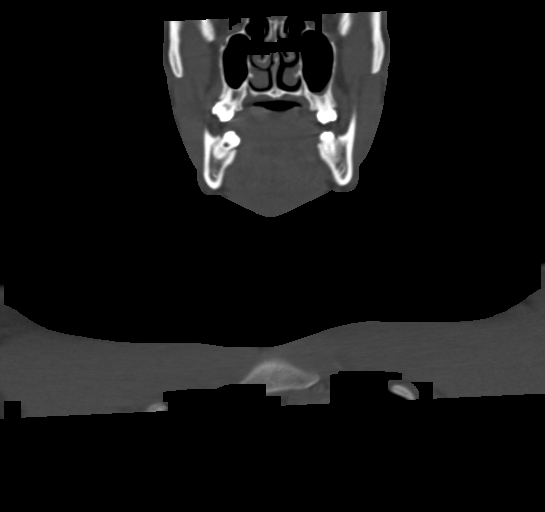
[im 53/120  bone]
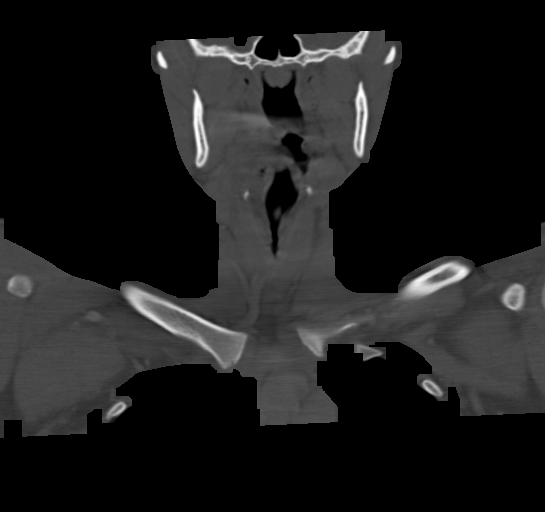
[im 68/120  bone]
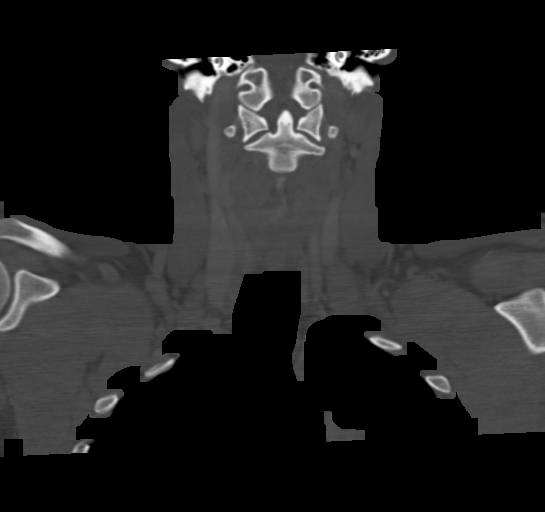

[Series 6: ax oropharynx · axial · 0.50mm/px · z∈[+13,+177]mm · 5 of 129 slices shown, 7 images]
[im 22/129  soft-tissue]
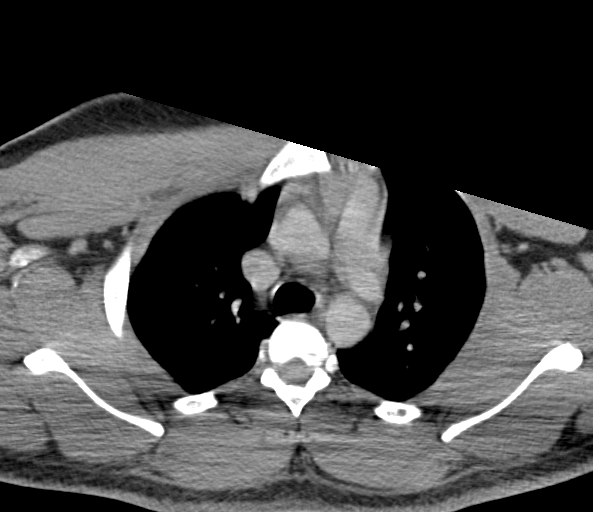
[im 22/129  bone]
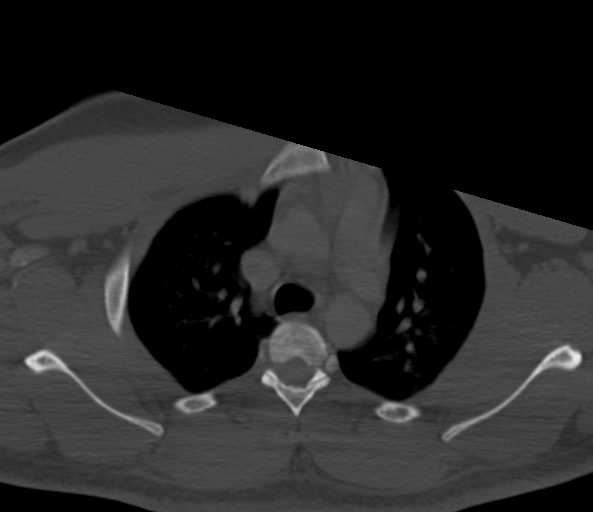
[im 43/129  bone]
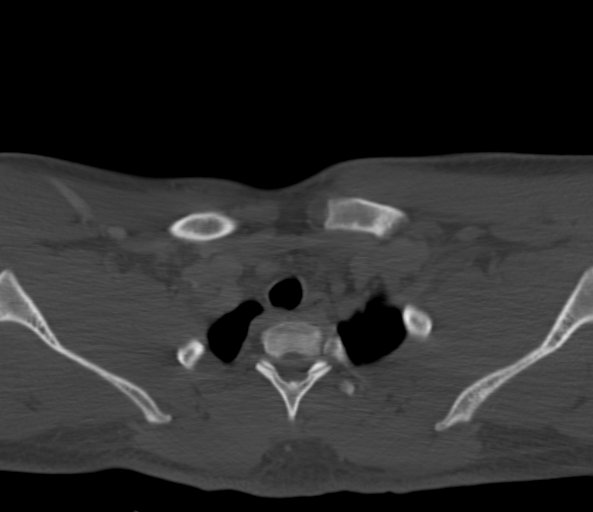
[im 65/129  bone]
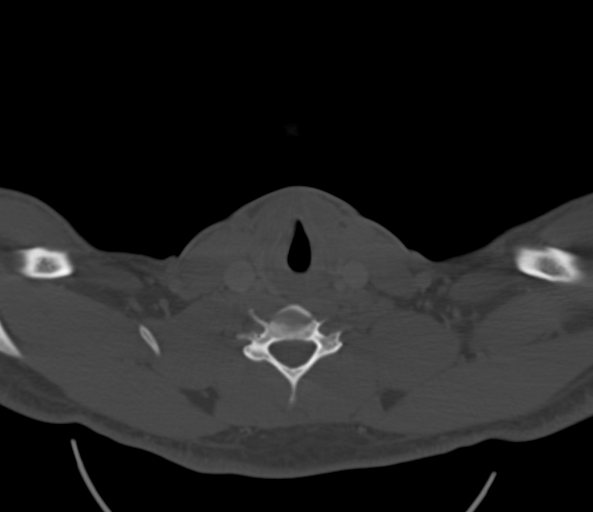
[im 86/129  bone]
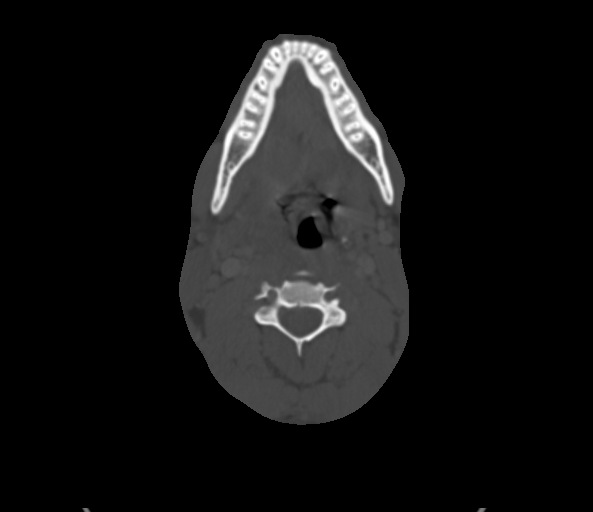
[im 107/129  soft-tissue]
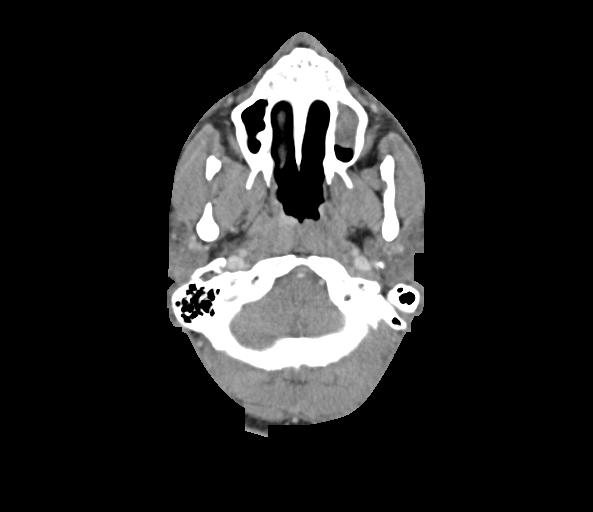
[im 107/129  bone]
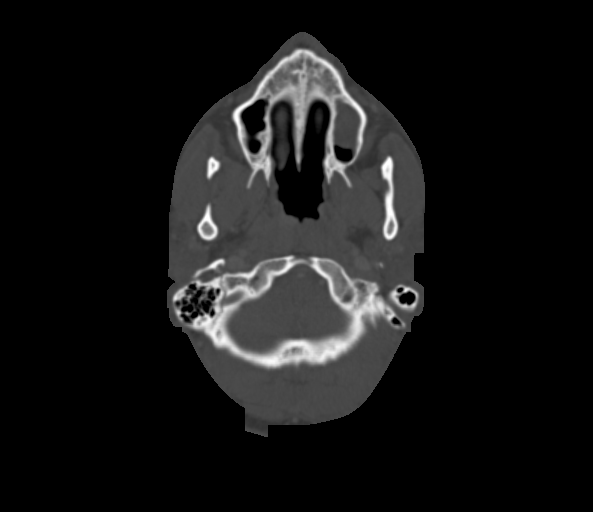

[13 of 35 positions shown; findings below may reference images not displayed]

FINDINGS: Pharynx and larynx: Diffuse inflammation of the right palatine
tonsil. Within the enlarged inflamed right palatine tonsil, there is
a 1.2 x 1 x 1 cm low-density component suggestive of contained
abscess. Breakthrough of inflammatory process with inflammation in
the right parapharyngeal space and retropharyngeal space/
prevertebral space. Streak artifacts slightly limits evaluation
however, the inflammatory component within the right parapharyngeal
space and retropharyngeal space currently does not have well-defined
borders to suggest well-defined drainable abscess in this region.
Patient is at risk for development of deep abscess.

The inflammatory process extends to level of the epiglottis (which
is slightly thickened) and the right aryepiglottic region.
Underfilling of the right piriform sinus secondary to surrounding
inflammation.

Salivary glands: No primary salivary gland abnormality.

Thyroid: No primary thyroid abnormality.

Lymph nodes: Diffuse adenopathy, greater right neck and most
prominent in the level 2 region.

Vascular: No thrombosis of the internal jugular vein.

Limited intracranial: Negative.

Visualized orbits: Only partially imaged and unremarkable.

Mastoids and visualized paranasal sinuses: Probable retention cyst
inferior left maxillary sinus.

Skeleton: Lordosis of the cervical spine may be related to
splinting. No obvious epidural extension of inflammatory process.

Upper chest: Clear.
IMPRESSION: Diffuse inflammation of the right palatine tonsil. Within the
enlarged inflamed right palatine tonsil, there is a 1.2 x 1 x 1 cm
low-density component suggestive of contained abscess. Breakthrough
of inflammatory process with inflammation in the right
parapharyngeal space and retropharyngeal space/ prevertebral space.
Streak artifacts slightly limits evaluation however, the
inflammatory component within the right parapharyngeal space and
retropharyngeal space currently does not have well-defined borders
to suggest well-defined drainable abscess in this region.

The inflammatory process extends to level of the epiglottis (which
is slightly thickened) and the right aryepiglottic region.
Underfilling of the right piriform sinus secondary to surrounding
inflammation.

Diffuse adenopathy, greater right neck and most prominent in the
level 2 region.

These results were called by telephone at the time of interpretation
on 03/21/2016 at [DATE] to JL BILLIOT, emergency room
physician's assistant, who verbally acknowledged these results.

## 2017-12-19 ENCOUNTER — Emergency Department: Payer: Self-pay

## 2017-12-19 ENCOUNTER — Emergency Department
Admission: EM | Admit: 2017-12-19 | Discharge: 2017-12-19 | Disposition: A | Discharge status: Home / Self Care | Payer: Self-pay | Attending: Emergency Medicine | Admitting: Emergency Medicine

## 2017-12-19 ENCOUNTER — Other Ambulatory Visit: Payer: Self-pay

## 2017-12-19 ENCOUNTER — Encounter: Payer: Self-pay | Admitting: Emergency Medicine

## 2017-12-19 DIAGNOSIS — R002 Palpitations: Secondary | ICD-10-CM | POA: Insufficient documentation

## 2017-12-19 DIAGNOSIS — R079 Chest pain, unspecified: Secondary | ICD-10-CM

## 2017-12-19 LAB — BASIC METABOLIC PANEL
Anion gap: 9 (ref 5–15)
BUN: 19 mg/dL (ref 6–20)
CHLORIDE: 107 mmol/L (ref 101–111)
CO2: 24 mmol/L (ref 22–32)
CREATININE: 1.18 mg/dL (ref 0.61–1.24)
Calcium: 8.8 mg/dL — ABNORMAL LOW (ref 8.9–10.3)
GFR calc Af Amer: >60 mL/min (ref >60)
GFR calc non Af Amer: >60 mL/min (ref >60)
GLUCOSE: 132 mg/dL — AB (ref 65–99)
Potassium: 4 mmol/L (ref 3.5–5.1)
Sodium: 140 mmol/L (ref 135–145)

## 2017-12-19 LAB — CBC
HCT: 41.7 % (ref 40.0–52.0)
HEMOGLOBIN: 14.2 g/dL (ref 13.0–18.0)
MCH: 31.3 pg (ref 26.0–34.0)
MCHC: 34.2 g/dL (ref 32.0–36.0)
MCV: 91.5 fL (ref 80.0–100.0)
Platelets: 168 10*3/uL (ref 150–440)
RBC: 4.55 MIL/uL (ref 4.40–5.90)
RDW: 13.3 % (ref 11.5–14.5)
WBC: 8 10*3/uL (ref 3.8–10.6)

## 2017-12-19 LAB — TROPONIN I: Troponin I: <0.03 ng/mL (ref <0.03)

## 2017-12-19 NOTE — ED Triage Notes (Signed)
Here for central constant chest pain that [facility] Unlabored. VSS. ambulatory to triage. No medical hx. No cough or fevers.  Color WNL

## 2017-12-19 NOTE — ED Provider Notes (Signed)
[facility] Emergency Department Provider Note  Time seen: 4:03 PM  I have reviewed the triage vital signs and the nursing notes.   HISTORY  Chief Complaint Chest Pain    HPI Derek Murphy. is a 27 y.o. male with no significant past medical history presents to the emergency department for intermittent chest discomfort and palpitations.  Patient states over the past 2 days he has been feeling a pressure sensation in his chest with palpitations feeling like his heart is beating or pounding fast in his chest.  Took his pulse and it was 120 bpm last night.  Patient states he had to leave work today because he felt like his heart was pounding in his chest again so he came to the ER.  Patient states he missed work yesterday as well due to the palpitations.  States he has been expensing intermittent chest discomfort and palpitations for the past 2-3 years but is never followed up with a cardiologist.   History reviewed. No pertinent past medical history.  Patient Active Problem List   Diagnosis Date Noted  . Tonsil, abscess 03/21/2016    History reviewed. No pertinent surgical history.  Prior to Admission medications   Medication Sig Start Date End Date Taking? Authorizing Provider  acetaminophen (TYLENOL) 325 MG tablet Take 2 tablets (650 mg total) by mouth every 6 (six) hours as needed for mild pain (or Fever >/= 101). 03/23/16   Gouru, Aruna, MD  amoxicillin-clavulanate (AUGMENTIN) 875-125 MG tablet Take 1 tablet by mouth 2 (two) times daily. 03/23/16   Ramonita Lab, MD  docusate sodium (COLACE) 100 MG capsule Take 1 capsule (100 mg total) by mouth 2 (two) times daily as needed for mild constipation. 03/23/16   Gouru, Deanna Artis, MD  predniSONE (STERAPRED UNI-PAK 21 TAB) 10 MG (21) TBPK tablet Take 1 tablet (10 mg total) by mouth daily. Take 6 tablets by mouth for 2 days followed by  5 tablets by mouth for 2 days followed by  4 tablets by mouth for 2 days followed  by  3 tablets by mouth for 2 days followed by  2 tablets by mouth for 2 days followed by  1 tablet by mouth for 2 days and stop 03/23/16   Ramonita Lab, MD    No Known Allergies  History reviewed. No pertinent family history.  Social History Social History   Tobacco Use  . Smoking status: Never Smoker  . Smokeless tobacco: Never Used  Substance Use Topics  . Alcohol use: Yes    Comment: occasional  . Drug use: No    Review of Systems Constitutional: Negative for fever. Eyes: Negative for visual complaints ENT: Negative for recent illness/congestion Cardiovascular: Positive for palpitations and chest tightness last night.  Palpitations continued today. Respiratory: Mild shortness of breath with the palpitations per patient. Gastrointestinal: Negative for abdominal pain, vomiting.  Negative for nausea. Genitourinary: Negative for urinary compaints Musculoskeletal: Negative for leg pain or swelling. Skin: Negative for skin complaints  Neurological: Negative for headache All other ROS negative  ____________________________________________   PHYSICAL EXAM:  VITAL SIGNS: ED Triage Vitals  Enc Vitals Group     BP 12/19/17 1430 115/65     Pulse Rate 12/19/17 1430 69     Resp 12/19/17 1430 18     Temp 12/19/17 1430 98.2 F (36.8 C)     Temp Source 12/19/17 1430 Oral     SpO2 12/19/17 1430 99 %     Weight 12/19/17 1429 205  lb (93 kg)     Height 12/19/17 1429 5\' 3"  (1.6 m)     Head Circumference --      Peak Flow --      Pain Score 12/19/17 1429 7     Pain Loc --      Pain Edu? --      Excl. in GC? --     Constitutional: Alert and oriented. Well appearing and in no distress. Eyes: Normal exam ENT   Head: Normocephalic and atraumatic   Mouth/Throat: Mucous membranes are moist. Cardiovascular: Normal rate, regular rhythm. No murmur.  Around 80-90 bpm. Respiratory: Normal respiratory effort without tachypnea nor retractions. Breath sounds are clear   Gastrointestinal: Soft and nontender. No distention.  Musculoskeletal: Nontender with normal range of motion in all extremities. No lower extremity tenderness or edema. Neurologic:  Normal speech and language. No gross focal neurologic deficits  Skin:  Skin is warm, dry and intact.  Psychiatric: Mood and affect are normal.   ____________________________________________    EKG  EKG reviewed and interpreted by myself shows normal sinus rhythm at 79 bpm with a narrow QRS, normal axis, normal intervals with nonspecific but no concerning ST changes.  ____________________________________________    RADIOLOGY  Chest x-ray normal  ____________________________________________   INITIAL IMPRESSION / ASSESSMENT AND PLAN / ED COURSE  Pertinent labs & imaging results that were available during my care of the patient were reviewed by me and considered in my medical decision making (see chart for details).  Patient presents to the emergency department for intermittent chest pain over the past 2-3 years, with palpitations and chest discomfort of the past 2 days.  Differential would include palpitations, ACS, chest wall pain.  Overall the patient appears extremely well with a normal physical exam.  Normal heart sounds.  Around 80 bpm.  Denies any chest pain at this time.  Patient states he has been working a lot has only had 3 days off in the past 2 weeks and thinks this is what is causing his discomfort and stress.  I discussed with the patient even though his results today are normal including EKG, chest x-ray labs that he should follow-up with a cardiologist as this has been an ongoing issue for 2-3 years.  Patient is agreeable to this plan of care.  He also states he has been experiencing intestinal issues over the past 2-3 years with intermittent episodes of diarrhea though denies any abdominal pain or diarrhea currently.  We will also refer to GI medicine.  States he was referred to GI medicine  2-3 years ago but never followed up.  ____________________________________________   FINAL CLINICAL IMPRESSION(S) / ED DIAGNOSES  Palpitations    Minna AntisPaduchowski, Barbara Ahart, MD 12/19/17 1614

## 2017-12-19 NOTE — Discharge Instructions (Signed)
You have been seen in the emergency department today for chest pain. Your workup has shown normal results. As we discussed please follow-up with your primary care physician in the next 1-2 days for recheck. Return to the emergency department for any further chest pain, trouble breathing, or any other symptom personally concerning to yourself. ° °Please call the number provided for cardiology to arrange a follow-up appointment as soon as possible. °

## 2019-03-05 IMAGING — CR DG CHEST 2V
1 series · 2 of 2 positions shown · non-contrast
Comparison: 04/01/2015

CLINICAL DATA: Chest pain

EXAM:
CHEST  2 VIEW

[Series 1: w chest pa · 0.14mm/px · 2 of 2 slices shown]
[im 1/2]
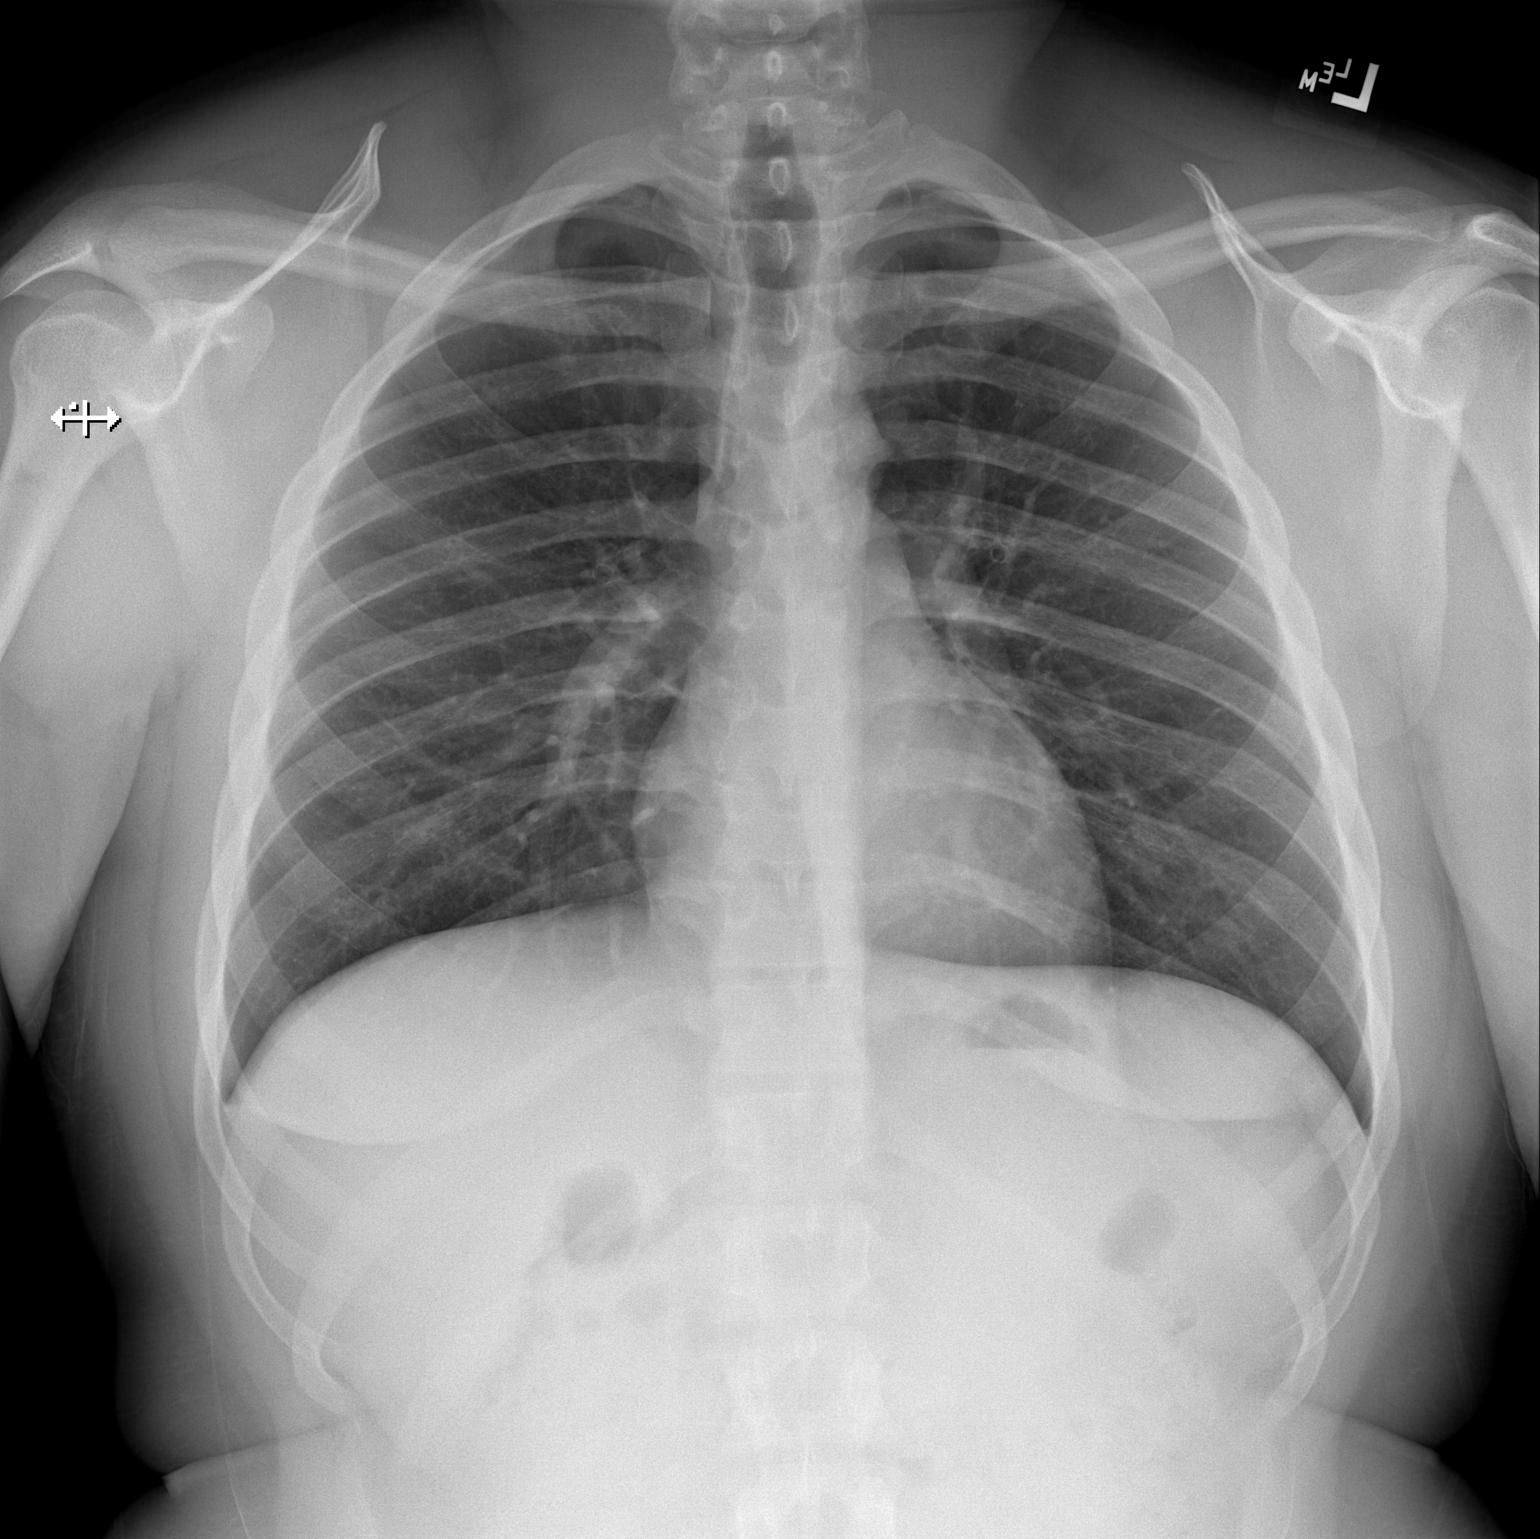
[im 2/2]
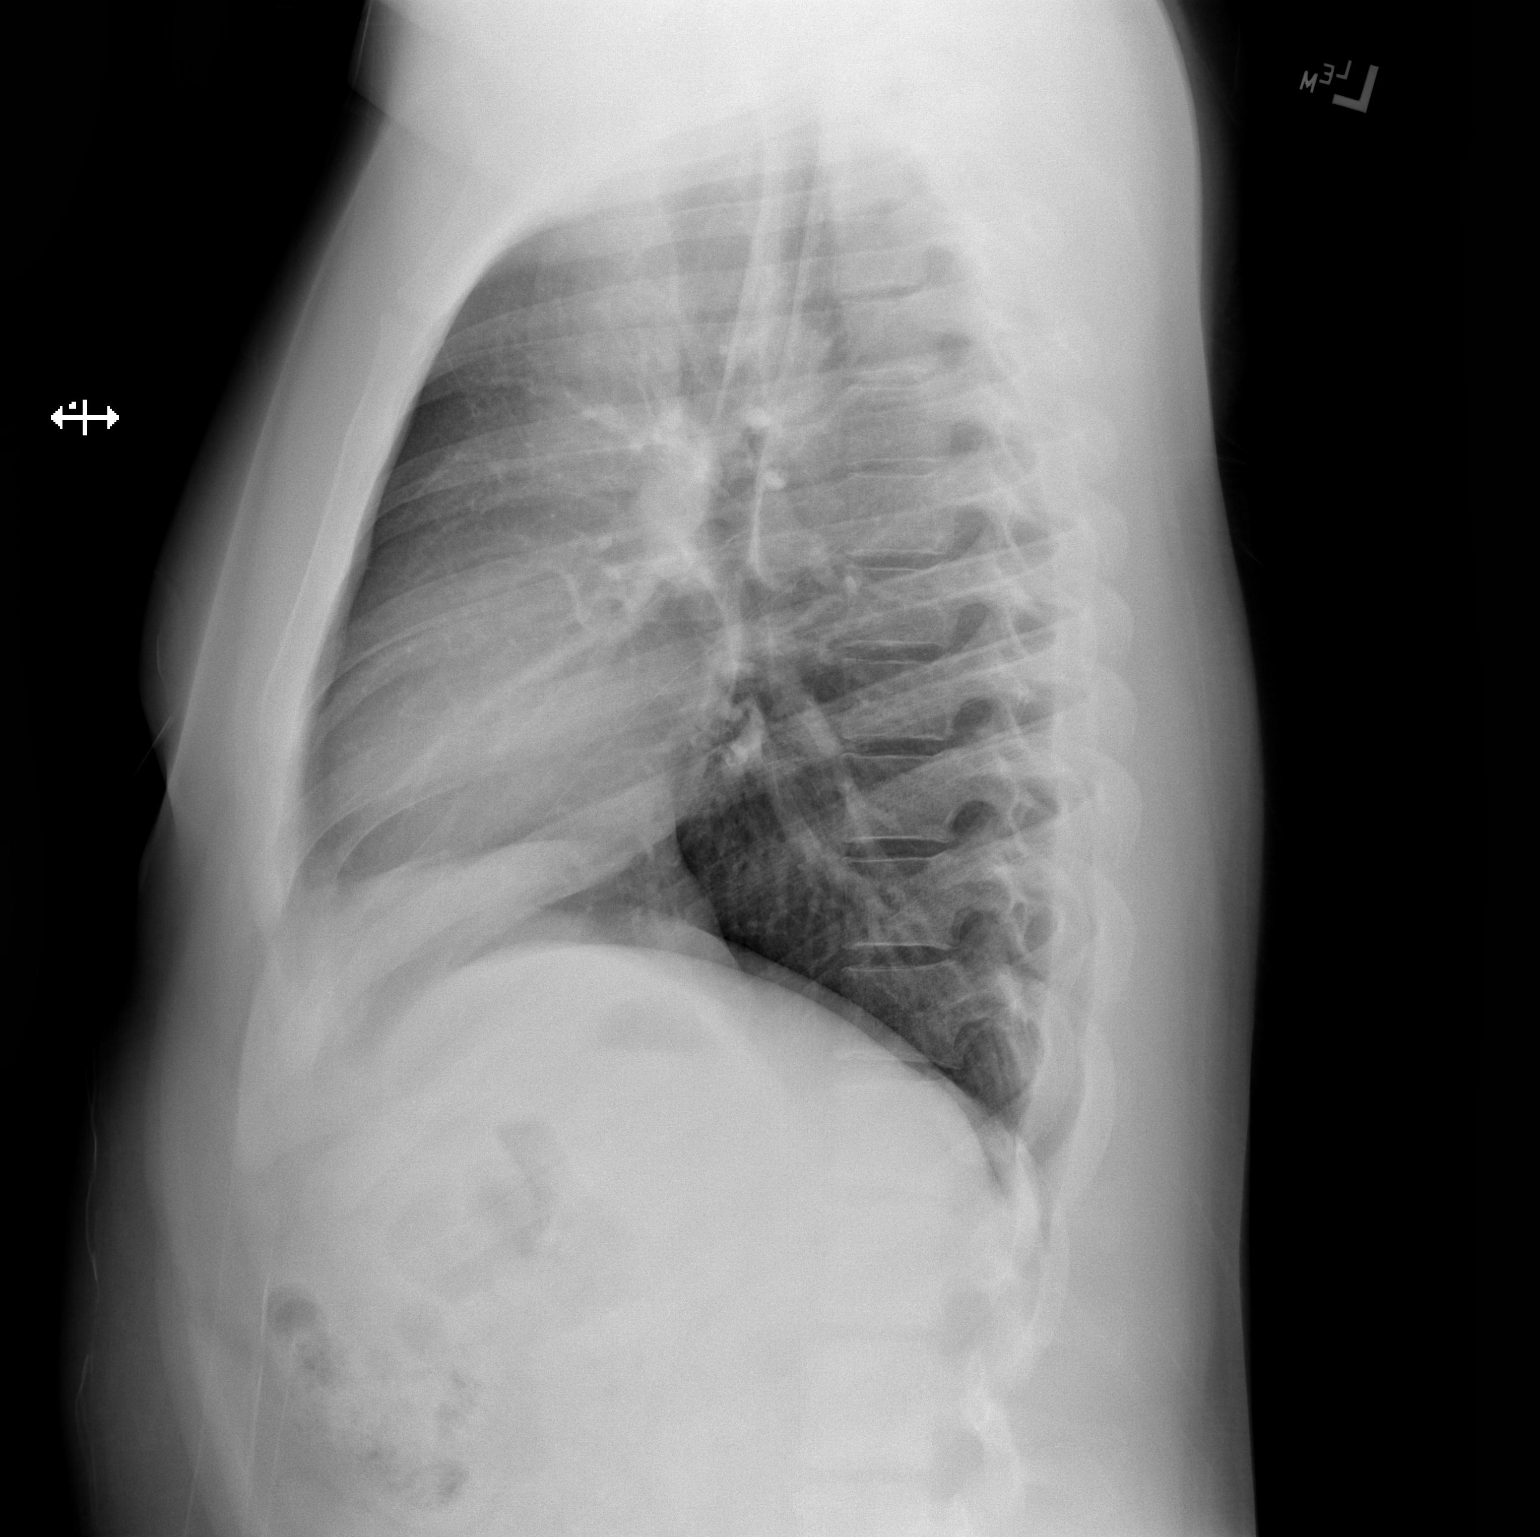

[2 of 2 positions shown; findings below may reference images not displayed]

FINDINGS: The heart size and mediastinal contours are within normal limits.
Both lungs are clear. The visualized skeletal structures are
unremarkable.
IMPRESSION: No active cardiopulmonary disease.

## 2022-02-16 ENCOUNTER — Encounter: Payer: Self-pay | Admitting: Emergency Medicine

## 2022-02-16 ENCOUNTER — Emergency Department
Admission: EM | Admit: 2022-02-16 | Discharge: 2022-02-16 | Disposition: A | Discharge status: Home / Self Care | Payer: Self-pay | Attending: Emergency Medicine | Admitting: Emergency Medicine

## 2022-02-16 ENCOUNTER — Other Ambulatory Visit: Payer: Self-pay

## 2022-02-16 ENCOUNTER — Emergency Department: Payer: Self-pay

## 2022-02-16 DIAGNOSIS — J039 Acute tonsillitis, unspecified: Secondary | ICD-10-CM | POA: Insufficient documentation

## 2022-02-16 DIAGNOSIS — R0981 Nasal congestion: Secondary | ICD-10-CM | POA: Insufficient documentation

## 2022-02-16 LAB — COMPREHENSIVE METABOLIC PANEL
ALT: 19 U/L (ref 0–44)
AST: 25 U/L (ref 15–41)
Albumin: 4 g/dL (ref 3.5–5.0)
Alkaline Phosphatase: 50 U/L (ref 38–126)
Anion gap: 7 (ref 5–15)
BUN: 17 mg/dL (ref 6–20)
CO2: 29 mmol/L (ref 22–32)
Calcium: 8.8 mg/dL — ABNORMAL LOW (ref 8.9–10.3)
Chloride: 104 mmol/L (ref 98–111)
Creatinine, Ser: 0.96 mg/dL (ref 0.61–1.24)
GFR, Estimated: >60 mL/min (ref >60)
Glucose, Bld: 115 mg/dL — ABNORMAL HIGH (ref 70–99)
Potassium: 3.6 mmol/L (ref 3.5–5.1)
Sodium: 140 mmol/L (ref 135–145)
Total Bilirubin: 0.5 mg/dL (ref 0.3–1.2)
Total Protein: 6.6 g/dL (ref 6.5–8.1)

## 2022-02-16 LAB — CBC WITH DIFFERENTIAL/PLATELET
Abs Immature Granulocytes: 0.03 10*3/uL (ref 0.00–0.07)
Basophils Absolute: 0 10*3/uL (ref 0.0–0.1)
Basophils Relative: 0 %
Eosinophils Absolute: 0.1 10*3/uL (ref 0.0–0.5)
Eosinophils Relative: 1 %
HCT: 38.7 % — ABNORMAL LOW (ref 39.0–52.0)
Hemoglobin: 13.3 g/dL (ref 13.0–17.0)
Immature Granulocytes: 0 %
Lymphocytes Relative: 17 %
Lymphs Abs: 1.4 10*3/uL (ref 0.7–4.0)
MCH: 30.9 pg (ref 26.0–34.0)
MCHC: 34.4 g/dL (ref 30.0–36.0)
MCV: 90 fL (ref 80.0–100.0)
Monocytes Absolute: 0.5 10*3/uL (ref 0.1–1.0)
Monocytes Relative: 6 %
Neutro Abs: 6.4 10*3/uL (ref 1.7–7.7)
Neutrophils Relative %: 76 %
Platelets: 137 10*3/uL — ABNORMAL LOW (ref 150–400)
RBC: 4.3 MIL/uL (ref 4.22–5.81)
RDW: 12.5 % (ref 11.5–15.5)
WBC: 8.5 10*3/uL (ref 4.0–10.5)
nRBC: 0 % (ref 0.0–0.2)

## 2022-02-16 LAB — GROUP A STREP BY PCR: Group A Strep by PCR: NOT DETECTED

## 2022-02-16 LAB — LACTIC ACID, PLASMA: Lactic Acid, Venous: 1.8 mmol/L (ref 0.5–1.9)

## 2022-02-16 LAB — MONONUCLEOSIS SCREEN: Mono Screen: NEGATIVE

## 2022-02-16 MED ORDER — IOHEXOL 300 MG/ML  SOLN
75.0000 mL | Freq: Once | INTRAMUSCULAR | Status: AC | PRN
  Administered 2022-02-16: 75 mL via INTRAVENOUS
  Filled 2022-02-16: qty 75

## 2022-02-16 MED ORDER — PREDNISONE 50 MG PO TABS: 50.0000 mg | ORAL_TABLET | Freq: Every day | ORAL | 0 refills | Status: DC

## 2022-02-16 MED ORDER — CLINDAMYCIN HCL 300 MG PO CAPS: 300.0000 mg | ORAL_CAPSULE | Freq: Four times a day (QID) | ORAL | 0 refills | Status: DC

## 2022-02-16 MED ORDER — CLINDAMYCIN HCL 300 MG PO CAPS: 300.0000 mg | ORAL_CAPSULE | Freq: Four times a day (QID) | ORAL | 0 refills | Status: AC

## 2022-02-16 MED ORDER — SODIUM CHLORIDE 0.9 % IV SOLN
3.0000 g | Freq: Once | INTRAVENOUS | Status: AC
  Administered 2022-02-16: 3 g via INTRAVENOUS
  Filled 2022-02-16: qty 8

## 2022-02-16 MED ORDER — MAGIC MOUTHWASH W/LIDOCAINE: 5.0000 mL | Freq: Four times a day (QID) | ORAL | 0 refills | Status: DC

## 2022-02-16 MED ORDER — DEXAMETHASONE SODIUM PHOSPHATE 10 MG/ML IJ SOLN
10.0000 mg | Freq: Once | INTRAMUSCULAR | Status: AC
  Administered 2022-02-16: 10 mg via INTRAVENOUS
  Filled 2022-02-16: qty 1

## 2022-02-16 MED ORDER — SODIUM CHLORIDE 0.9 % IV BOLUS
1000.0000 mL | Freq: Once | INTRAVENOUS | Status: AC
  Administered 2022-02-16: 1000 mL via INTRAVENOUS

## 2022-02-16 NOTE — ED Provider Notes (Signed)
? ?[facility] ?Provider Note ? ?Patient Contact: 4:57 PM (approximate) ? ? ?History  ? ?Oral Swelling ? ? ?HPI ? ?Derek Murphy. is a 31 y.o. male who presents the emergency department complaining of sore throat with a "lump" that he can palpate in the right throat.  Patient states that he feels like his voice has changed somewhat.  States that he is having a hard time swallowing his saliva.  He does have some nasal congestion but denies any fevers, chills, cough, GI symptoms.  No medications prior to arrival.  He states that he does have a history of tonsillitis but this feels "different." ?  ? ? ?Physical Exam  ? ?Triage Vital Signs: ?ED Triage Vitals [02/16/22 1553]  ?Enc Vitals Group  ?   BP 111/74  ?   Pulse Rate 83  ?   Resp 18  ?   Temp 98.8 ?F (37.1 ?C)  ?   Temp Source Oral  ?   SpO2 97 %  ?   Weight 205 lb 0.4 oz (93 kg)  ?   Height 5\' 3"  (1.6 m)  ?   Head Circumference   ?   Peak Flow   ?   Pain Score 10  ?   Pain Loc   ?   Pain Edu?   ?   Excl. in GC?   ? ? ?Most recent vital signs: ?Vitals:  ? 02/16/22 1553  ?BP: 111/74  ?Pulse: 83  ?Resp: 18  ?Temp: 98.8 ?F (37.1 ?C)  ?SpO2: 97%  ? ? ? ?General: Alert and in no acute distress. ?ENT: ?     Ears:  ?     Nose: No congestion/rhinnorhea. ?     Mouth/Throat: Mucous membranes are moist.  Visualization of the oropharynx reveals tonsillar hypertrophy bilaterally but worse on the right than left.  Slight uvular deviation.  No exudates identified.  Patient is holding a full mouth of spit, spits into the trash can versus swallowing.  However is able to swallow on command. ?Neck: No stridor. No cervical spine tenderness to palpation.  No gross erythema, edema of the anterior neck.  No tenderness to the anterior neck. ?Hematological/Lymphatic/Immunilogical: Right-sided anterior cervical lymphadenopathy. ?Cardiovascular:  Good peripheral perfusion ?Respiratory: Normal respiratory effort without tachypnea or retractions. Lungs CTAB. Good  air entry to the bases with no decreased or absent breath sounds. ?Musculoskeletal: Full range of motion to all extremities.  ?Neurologic:  No gross focal neurologic deficits are appreciated.  ?Skin:   No rash noted ?Other: ? ? ?ED Results / Procedures / Treatments  ? ?Labs ?(all labs ordered are listed, but only abnormal results are displayed) ?Labs Reviewed  ?COMPREHENSIVE METABOLIC PANEL - Abnormal; Notable for the following components:  ?    Result Value  ? Glucose, Bld 115 (*)   ? Calcium 8.8 (*)   ? All other components within normal limits  ?CBC WITH DIFFERENTIAL/PLATELET - Abnormal; Notable for the following components:  ? HCT 38.7 (*)   ? Platelets 137 (*)   ? All other components within normal limits  ?GROUP A STREP BY PCR  ?LACTIC ACID, PLASMA  ?MONONUCLEOSIS SCREEN  ?LACTIC ACID, PLASMA  ? ? ? ?EKG ? ? ? ? ?RADIOLOGY ? ?I personally viewed and evaluated these images as part of my medical decision making, as well as reviewing the written report by the radiologist. ? ?ED Provider Interpretation: Findings on CT scan revealed unequal tonsillar hypertrophy.  There is a  subcentimeter area in the right tonsil that appears to be a forming abscess without acute fluid collection. ? ?CT Soft Tissue Neck W Contrast ? ?Result Date: 02/16/2022 ?CLINICAL DATA:  Tonsillar hypertrophy right-side with uvular deviation. Sore throat. Rule out abscess. EXAM: CT NECK WITH CONTRAST TECHNIQUE: Multidetector CT imaging of the neck was performed using the standard protocol following the bolus administration of intravenous contrast. RADIATION DOSE REDUCTION: This exam was performed according to the departmental dose-optimization program which includes automated exposure control, adjustment of the mA and/or kV according to patient size and/or use of iterative reconstruction technique. CONTRAST:  77mL OMNIPAQUE IOHEXOL 300 MG/ML  SOLN COMPARISON:  CT soft tissue neck is 03/21/2016 FINDINGS: Pharynx and larynx: Asymmetric enlargement  of the right tonsil with edema. Small subcentimeter fluid collection is present in the right tonsil which may be early abscess. In addition, there is thickening and edema of the right lateral pharyngeal wall extending to the epiglottis with thickening of the right aryepiglottic fold. Findings are similar to that seen in 2017. Airway intact. Vocal cords normal. Salivary glands: No inflammation, mass, or stone. Thyroid: Negative Lymph nodes: 1 cm right level 2 lymph node. No pathologic adenopathy in the neck. Vascular: Normal vascular enhancement. Limited intracranial: Negative Visualized orbits: Not imaged Mastoids and visualized paranasal sinuses: Mucosal edema in the maxillary sinus bilaterally. Remaining sinuses clear. Mastoid clear. Skeleton: No acute skeletal abnormality. Caries right upper third molar. Upper chest: Lung apices clear bilaterally. Other: None IMPRESSION: Asymmetric enlargement of the right tonsil which appears inflammatory. There is edema in the right lateral pharyngeal wall. Small subcentimeter fluid collection in the right tonsil compatible with developing abscess. No well-developed drainable abscess identified. Findings similar to CT of 2017 suggest recurrent tonsillitis on the right. Airway intact. Electronically Signed   By: Marlan Palau M.D.   On: 02/16/2022 18:19   ? ?PROCEDURES: ? ?Critical Care performed: No ? ?Procedures ? ? ?MEDICATIONS ORDERED IN ED: ?Medications  ?sodium chloride 0.9 % bolus 1,000 mL (1,000 mLs Intravenous New Bag/Given 02/16/22 1729)  ?dexamethasone (DECADRON) injection 10 mg (10 mg Intravenous Given 02/16/22 1740)  ?Ampicillin-Sulbactam (UNASYN) 3 g in sodium chloride 0.9 % 100 mL IVPB (3 g Intravenous New Bag/Given 02/16/22 1740)  ?iohexol (OMNIPAQUE) 300 MG/ML solution 75 mL (75 mLs Intravenous Contrast Given 02/16/22 1757)  ? ? ? ?IMPRESSION / MDM / ASSESSMENT AND PLAN / ED COURSE  ?I reviewed the triage vital signs and the nursing notes. ?             ?                ? ?Differential diagnosis includes, but is not limited to, peritonsillar abscess, tonsillitis, strep, mono ? ? ?Patient's diagnosis is consistent with tonsillitis.  Patient presents emergency department with sore throat, voice changes.  Physical exam was concerning as there was unequal tonsillar hypertrophy the patient could have a peritonsillar abscess.  Given this patient had labs, Unasyn, Decadron and CT scan.  Labs are reassuring with no elevation of the white blood cell count.  Metabolic panel is reassuring, negative mono.  Negative strep test.  Patient CT scan revealed unequal tonsillar hypertrophy worse on the right than left which matches physical exam.  Patient did have a small subcentimeter area of what appears to be a possible developing abscess but there is no definitive fluid collection to be drained at this time.  Given the size as well patient will do well with antibiotics.  Given the concern  for peritonsillar abscess initially admission was considered but with reassuring work-up I feel the patient is stable for discharge.  I have cautioned the patient at length to return to the ED for any worsening symptoms..  Patient will have clindamycin, prednisone, Magic mouthwash for at home use.  Follow-up primary care as needed.  Patient is given ED precautions to return to the ED for any worsening or new symptoms. ? ? ? ?  ? ? ?FINAL CLINICAL IMPRESSION(S) / ED DIAGNOSES  ? ?Final diagnoses:  ?Tonsillitis  ? ? ? ?Rx / DC Orders  ? ?ED Discharge Orders   ? ?      Ordered  ?  clindamycin (CLEOCIN) 300 MG capsule  4 times daily       ? 02/16/22 1944  ?  predniSONE (DELTASONE) 50 MG tablet  Daily with breakfast       ? 02/16/22 1944  ?  magic mouthwash w/lidocaine SOLN  4 times daily       ?Note to Pharmacy: Dispense in a 1/1/1 ratio. Use lidocaine, diphenhydramine, prednisolone  ? 02/16/22 1944  ? ?  ?  ? ?  ? ? ? ?Note:  This document was prepared using Dragon voice recognition software and may include  unintentional dictation errors. ?  ?Racheal PatchesCuthriell, Satine Hausner D, PA-C ?02/16/22 1945 ? ?  ?Chesley NoonJessup, Charles, MD ?02/18/22 1105 ? ?

## 2022-02-16 NOTE — ED Triage Notes (Signed)
Pt here with throat pain that started this morning. Pt states he feels a knot on the right side of his throat and it is painful to swallow. Pt states he thinks that his tonsils may be swollen. No white patches seen by this RN. Pt stable in triage. ?

## 2023-05-06 NOTE — Progress Notes (Unsigned)
Celso Amy, PA-C [address]  [address]  [place], [place] [address]  Main: [phone]  Fax: [phone]   Gastroenterology Consultation  Referring Provider:     No ref. provider found Primary Care Physician:  Pcp, No Primary Gastroenterologist:  Celso Amy, PA-C / Dr. Wyline Mood  Reason for Consultation:     Abdominal Pain        HPI:   Derek Velde. is a 32 y.o. y/o male is self referred for consultation & management.    Patient states he has had chronic intermittent lower abdominal cramping, diarrhea, loose stools for many years.  It comes and goes.  It has been recurrent for 1 month.  Feels like a twisting severe pain in his lower abdomen RLQ and LLQ.  Currently the pain is 1 or 2 out of 10, improved.  He has 5 or 6 loose stools per day.  Denies watery diarrhea.  Has occasional bright red blood in his stools after having a lot of bowel movements.  He denies unintentional weight loss.  He denies any current or recent upper GI symptoms.  Denies heartburn.  No family history of GI malignancies, IBD, or celiac.  Last abdominal pelvic CT In 2015 showed no acute abnormality.  .  No previous GI evaluation.  01/2022 showed normal CBC and CMP.  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  Platelets 137.    History reviewed. No pertinent past medical history.  History reviewed. No pertinent surgical history.  Prior to Admission medications   Medication Sig Start Date End Date Taking? Authorizing Provider  acetaminophen (TYLENOL) 325 MG tablet Take 2 tablets (650 mg total) by mouth every 6 (six) hours as needed for mild pain (or Fever >/= 101). 03/23/16   Ramonita Lab, MD    History reviewed. No pertinent family history.   Social History   Tobacco Use   Smoking status: Never   Smokeless tobacco: Never  Substance Use Topics   Alcohol use: Yes    Comment: occasional   Drug use: No    Allergies as of 05/07/2023   (No Known Allergies)    Review of  Systems:    All systems reviewed and negative except where noted in HPI.   Physical Exam:  BP 107/75   Pulse 70   Temp 98.2 F (36.8 C)   Ht 5\' 3"  (1.6 m)   Wt 188 lb 6.4 oz (85.5 kg)   BMI 33.37 kg/m  No LMP for male patient. Psych:  Alert and cooperative. Normal mood and affect. General:   Alert,  Well-developed, well-nourished, pleasant and cooperative in NAD Head:  Normocephalic and atraumatic. Eyes:  Sclera clear, no icterus.   Conjunctiva pink. Neck:  Supple; no masses or thyromegaly. Lungs:  Respirations even and unlabored.  Clear throughout to auscultation.   No wheezes, crackles, or rhonchi. No acute distress. Heart:  Regular rate and rhythm; no murmurs, clicks, rubs, or gallops. Abdomen:  Normal bowel sounds.  No bruits.  Soft, and non-distended without masses, hepatosplenomegaly or hernias noted.  There is some bilateral lower abdominal Tenderness, RLQ > LLQ.  No upper abdominal tenderness.  No guarding or rebound tenderness.    Neurologic:  Alert and oriented x3;  grossly normal neurologically. Psych:  Alert and cooperative. Normal mood and affect.  Imaging Studies: No results found.  Assessment and Plan:   Derek Jeff. is a 32 y.o. y/o male is self referred for chronic GI symptoms  for many years.  Mainly he has lower abdominal cramping, and loose stools.  Occasional episode of mild bright red rectal bleeding.  No previous GI evaluation.  I am ordering lab work, stool studies, and colonoscopy for evaluation.  Differential diagnosis includes irritable bowel syndrome, inflammatory bowel disease, and Celiac.  He avoids milk / dairy.  Diarrhea, Chronic  Labs: CBC, CMP, celiac  Stool studies: GI pathogen panel, fecal elastase  Lower Abdominal Cramping - Suspect IBS  Start dicyclomine 10 mg 3 times daily as needed abdominal cramping.  Rectal Bleeding  Scheduling Colonoscopy I discussed risks of colonoscopy with patient to include risk of bleeding, colon  perforation, and risk of sedation.   Patient expressed understanding and agrees to proceed with colonoscopy.    Follow up in 3 months with TG.  Celso Amy, PA-C

## 2023-05-07 ENCOUNTER — Ambulatory Visit (INDEPENDENT_AMBULATORY_CARE_PROVIDER_SITE_OTHER): Payer: 59 | Admitting: Physician Assistant

## 2023-05-07 ENCOUNTER — Encounter: Payer: Self-pay | Admitting: Physician Assistant

## 2023-05-07 VITALS — BP 107/75 | HR 70 | Temp 98.2°F | Ht 63.0 in | Wt 188.4 lb

## 2023-05-07 DIAGNOSIS — R1084 Generalized abdominal pain: Secondary | ICD-10-CM | POA: Diagnosis not present

## 2023-05-07 DIAGNOSIS — K625 Hemorrhage of anus and rectum: Secondary | ICD-10-CM

## 2023-05-07 DIAGNOSIS — F10920 Alcohol use, unspecified with intoxication, uncomplicated: Secondary | ICD-10-CM | POA: Insufficient documentation

## 2023-05-07 DIAGNOSIS — R197 Diarrhea, unspecified: Secondary | ICD-10-CM

## 2023-05-07 MED ORDER — PEG 3350-KCL-NA BICARB-NACL 420 G PO SOLR: 4000.0000 mL | Freq: Once | ORAL | 0 refills | Status: AC

## 2023-05-07 MED ORDER — DICYCLOMINE HCL 10 MG PO CAPS: 10.0000 mg | ORAL_CAPSULE | Freq: Three times a day (TID) | ORAL | 2 refills | Status: AC

## 2023-05-08 LAB — COMPREHENSIVE METABOLIC PANEL
ALT: 16 IU/L (ref 0–44)
AST: 20 IU/L (ref 0–40)
Albumin/Globulin Ratio: 2.1
Albumin: 4.6 g/dL (ref 4.3–5.2)
Alkaline Phosphatase: 65 IU/L (ref 44–121)
BUN/Creatinine Ratio: 13 (ref 9–20)
BUN: 12 mg/dL (ref 6–20)
Bilirubin Total: 0.2 mg/dL (ref 0.0–1.2)
CO2: 22 mmol/L (ref 20–29)
Calcium: 9 mg/dL (ref 8.7–10.2)
Chloride: 103 mmol/L (ref 96–106)
Creatinine, Ser: 0.96 mg/dL (ref 0.76–1.27)
Globulin, Total: 2.2 g/dL (ref 1.5–4.5)
Glucose: 93 mg/dL (ref 70–99)
Potassium: 3.8 mmol/L (ref 3.5–5.2)
Sodium: 139 mmol/L (ref 134–144)
Total Protein: 6.8 g/dL (ref 6.0–8.5)
eGFR: 110 mL/min/{1.73_m2} (ref >59)

## 2023-05-08 LAB — CBC WITH DIFFERENTIAL
Basophils Absolute: 0 10*3/uL (ref 0.0–0.2)
Basos: 0 %
EOS (ABSOLUTE): 0.1 10*3/uL (ref 0.0–0.4)
Eos: 2 %
Hematocrit: 39.8 % (ref 37.5–51.0)
Hemoglobin: 13.5 g/dL (ref 13.0–17.7)
Immature Grans (Abs): 0 10*3/uL (ref 0.0–0.1)
Immature Granulocytes: 0 %
Lymphocytes Absolute: 1.9 10*3/uL (ref 0.7–3.1)
Lymphs: 37 %
MCH: 30.6 pg (ref 26.6–33.0)
MCHC: 33.9 g/dL (ref 31.5–35.7)
MCV: 90 fL (ref 79–97)
Monocytes Absolute: 0.3 10*3/uL (ref 0.1–0.9)
Monocytes: 6 %
Neutrophils Absolute: 2.7 10*3/uL (ref 1.4–7.0)
Neutrophils: 55 %
RBC: 4.41 x10E6/uL (ref 4.14–5.80)
RDW: 13.3 % (ref 11.6–15.4)
WBC: 5 10*3/uL (ref 3.4–10.8)

## 2023-05-08 LAB — CELIAC DISEASE AB SCREEN W/RFX
Antigliadin Abs, IgA: 6 units (ref 0–19)
IgA/Immunoglobulin A, Serum: 143 mg/dL (ref 90–386)
Transglutaminase IgA: <2 U/mL (ref 0–3)

## 2023-05-09 ENCOUNTER — Telehealth: Payer: Self-pay

## 2023-05-09 NOTE — Progress Notes (Signed)
Notify patient CBC and CMP labs are normal.  Celiac labs are negative.  No evidence of gluten allergy.  White count, hemoglobin, blood sugar, kidney and liver test are all normal.  Continue with current plan.

## 2023-05-09 NOTE — Telephone Encounter (Signed)
Patient notified.  Notify patient CBC and CMP labs are normal.  Celiac labs are negative.  No evidence of gluten allergy.  White count, hemoglobin, blood sugar, kidney and liver test are all normal.  Continue with current plan.

## 2023-06-05 ENCOUNTER — Telehealth: Payer: Self-pay

## 2023-06-05 NOTE — Telephone Encounter (Signed)
Voice message has been left for patient to call office back due to his 06/29/23 colonoscopy has been rescheduled to [facility] 07/11/23 due to change in Dr. Johnney Killian schedule.  Trish in Endo has been advised of date change.  Instructions updated.  Referral updated.  Thanks,  Marcelino Duster CMA

## 2023-06-19 ENCOUNTER — Telehealth: Payer: Self-pay

## 2023-06-19 NOTE — Telephone Encounter (Signed)
We received returned mail of patient colonoscopy instructions that were mailed out to the patient. Patient colonoscopy is schedule for 07/11/2023

## 2023-06-28 ENCOUNTER — Telehealth: Payer: Self-pay

## 2023-06-28 NOTE — Telephone Encounter (Signed)
Contacted patient to see if he received my message in regard to rescheduling his colonoscopy.  He said he did but didn't realize he was supposed to called me back to confirm.  I asked him if we could reschedule from the date offered because its full.  He said yes. I've offered to reschedule him to 07/02/23 with Dr. Allegra Lai. Trish in Endo notified of date change and physician change.  Thanks, [place], [place]

## 2023-07-02 ENCOUNTER — Ambulatory Visit: Payer: 59 | Admitting: Anesthesiology

## 2023-07-02 ENCOUNTER — Ambulatory Visit
Admission: RE | Admit: 2023-07-02 | Discharge: 2023-07-02 | Disposition: A | Discharge status: Home / Self Care | Payer: 59 | Attending: Gastroenterology | Admitting: Gastroenterology

## 2023-07-02 ENCOUNTER — Encounter: Payer: Self-pay | Admitting: Gastroenterology

## 2023-07-02 ENCOUNTER — Encounter
Admission: RE | Disposition: A | Discharge status: Home / Self Care | Payer: Self-pay | Source: Home / Self Care | Attending: Gastroenterology

## 2023-07-02 DIAGNOSIS — R197 Diarrhea, unspecified: Secondary | ICD-10-CM | POA: Diagnosis not present

## 2023-07-02 DIAGNOSIS — K529 Noninfective gastroenteritis and colitis, unspecified: Secondary | ICD-10-CM | POA: Diagnosis present

## 2023-07-02 HISTORY — PX: COLONOSCOPY WITH PROPOFOL: SHX5780

## 2023-07-02 SURGERY — COLONOSCOPY WITH PROPOFOL
Anesthesia: General

## 2023-07-02 MED ORDER — SODIUM CHLORIDE 0.9 % IV SOLN
INTRAVENOUS | Status: DC
  Administered 2023-07-02: 1000 mL via INTRAVENOUS

## 2023-07-02 MED ORDER — PROPOFOL 10 MG/ML IV BOLUS
INTRAVENOUS | Status: DC | PRN
Start: 2023-07-02 — End: 2023-07-02
  Administered 2023-07-02: 100 mg via INTRAVENOUS
  Administered 2023-07-02: 200 ug/kg/min via INTRAVENOUS

## 2023-07-02 MED ORDER — LIDOCAINE HCL (CARDIAC) PF 100 MG/5ML IV SOSY
PREFILLED_SYRINGE | INTRAVENOUS | Status: DC | PRN
  Administered 2023-07-02: 60 mg via INTRAVENOUS

## 2023-07-02 MED ORDER — PROPOFOL 10 MG/ML IV BOLUS
INTRAVENOUS | Status: AC
  Filled 2023-07-02: qty 20

## 2023-07-02 NOTE — Op Note (Signed)
[facility] Patient Name: Aravind Taiwo Procedure Date: 07/02/2023 10:55 AM MRN: [id number] Account #: [id number] Date of Birth: August 25, 1991 Admit Type: Outpatient Age: 32 Room: [facility] ENDO ROOM 4 Gender: Male Note Status: Finalized Instrument Name: Colonoscope [id number] Procedure:             Colonoscopy Indications:           This is the patient's first colonoscopy, Chronic                         diarrhea, Clinically significant diarrhea of                         unexplained origin Providers:             Toney Reil MD, MD Medicines:             General Anesthesia Complications:         No immediate complications. Estimated blood loss: None. Procedure:             Pre-Anesthesia Assessment:                        - Prior to the procedure, a History and Physical was                         performed, and patient medications and allergies were                         reviewed. The patient is competent. The risks and                         benefits of the procedure and the sedation options and                         risks were discussed with the patient. All questions                         were answered and informed consent was obtained.                         Patient identification and proposed procedure were                         verified by the physician, the nurse, the                         anesthesiologist, the anesthetist and the technician                         in the pre-procedure area in the procedure room in the                         endoscopy suite. Mental Status Examination: alert and                         oriented. Airway Examination: normal oropharyngeal                         airway and neck mobility. Respiratory Examination:  clear to auscultation. CV Examination: normal.                         Prophylactic Antibiotics: The patient does not require                         prophylactic  antibiotics. Prior Anticoagulants: The                         patient has taken no anticoagulant or antiplatelet                         agents. ASA Grade Assessment: II - A patient with mild                         systemic disease. After reviewing the risks and                         benefits, the patient was deemed in satisfactory                         condition to undergo the procedure. The anesthesia                         plan was to use general anesthesia. Immediately prior                         to administration of medications, the patient was                         re-assessed for adequacy to receive sedatives. The                         heart rate, respiratory rate, oxygen saturations,                         blood pressure, adequacy of pulmonary ventilation, and                         response to care were monitored throughout the                         procedure. The physical status of the patient was                         re-assessed after the procedure.                        After obtaining informed consent, the colonoscope was                         passed under direct vision. Throughout the procedure,                         the patient's blood pressure, pulse, and oxygen                         saturations were monitored continuously. The  Colonoscope was introduced through the anus and                         advanced to the the cecum, identified by appendiceal                         orifice and ileocecal valve. The colonoscopy was                         performed without difficulty. The patient tolerated                         the procedure well. The quality of the bowel                         preparation was poor. The ileocecal valve, appendiceal                         orifice, and rectum were photographed. Findings:      The perianal and digital rectal examinations were normal. Pertinent       negatives include normal  sphincter tone and no palpable rectal lesions.      Normal mucosa was found in the entire colon. Biopsies were taken with a       cold forceps for histology.      Copious quantities of semi-liquid semi-solid stool was found in the       entire colon, precluding visualization.      The retroflexed view of the distal rectum and anal verge was normal and       showed no anal or rectal abnormalities. Impression:            - Preparation of the colon was poor.                        - Normal mucosa in the entire examined colon. Biopsied.                        - Stool in the entire examined colon.                        - The distal rectum and anal verge are normal on                         retroflexion view. Recommendation:        - Discharge patient to home (with escort).                        - Resume previous diet today.                        - Continue present medications.                        - Await pathology results.                        - Return to GI clinic as previously scheduled. Procedure Code(s):     --- Professional ---  16109, Colonoscopy, flexible; with biopsy, single or                         multiple Diagnosis Code(s):     --- Professional ---                        K52.9, Noninfective gastroenteritis and colitis,                         unspecified                        R19.7, Diarrhea, unspecified CPT copyright 2022 American Medical Association. All rights reserved. The codes documented in this report are preliminary and upon coder review may  be revised to meet current compliance requirements. Dr. Libby Maw Toney Reil MD, MD 07/02/2023 11:26:43 AM This report has been signed electronically. Number of Addenda: 0 Note Initiated On: 07/02/2023 10:55 AM Scope Withdrawal Time: 0 hours 5 minutes 10 seconds  Total Procedure Duration: 0 hours 9 minutes 1 second  Estimated Blood Loss:  Estimated blood loss: none.      [facility]

## 2023-07-02 NOTE — Transfer of Care (Signed)
Immediate Anesthesia Transfer of Care Note  Patient: Derek Murphy.  Procedure(s) Performed: COLONOSCOPY WITH PROPOFOL  Patient Location: PACU  Anesthesia Type: General  Level of Consciousness: awake, alert  and patient cooperative  Airway and Oxygen Therapy: Patient Spontanous Breathing and Patient connected to supplemental oxygen  Post-op Assessment: Post-op Vital signs reviewed, Patient's Cardiovascular Status Stable, Respiratory Function Stable, Patent Airway and No signs of Nausea or vomiting  Post-op Vital Signs: Reviewed and stable  Complications: There were no known notable events for this encounter.

## 2023-07-02 NOTE — Anesthesia Preprocedure Evaluation (Addendum)
Anesthesia Evaluation  Patient identified by MRN, date of birth, ID band Patient awake    Reviewed: Allergy & Precautions, H&P , NPO status , Patient's Chart, lab work & pertinent test results  Airway Mallampati: II  TM Distance: >3 FB Neck ROM: full    Dental no notable dental hx.    Pulmonary neg pulmonary ROS, Patient abstained from smoking.   Pulmonary exam normal        Cardiovascular negative cardio ROS Normal cardiovascular exam     Neuro/Psych negative neurological ROS  negative psych ROS   GI/Hepatic negative GI ROS, Neg liver ROS,,,  Endo/Other  negative endocrine ROS    Renal/GU negative Renal ROS  negative genitourinary   Musculoskeletal   Abdominal   Peds  Hematology negative hematology ROS (+)   Anesthesia Other Findings DIARRHEA  Reproductive/Obstetrics negative OB ROS                             Anesthesia Physical Anesthesia Plan  ASA: 2  Anesthesia Plan: General   Post-op Pain Management:    Induction: Intravenous  PONV Risk Score and Plan: Propofol infusion and TIVA  Airway Management Planned: Natural Airway  Additional Equipment:   Intra-op Plan:   Post-operative Plan:   Informed Consent: I have reviewed the patients History and Physical, chart, labs and discussed the procedure including the risks, benefits and alternatives for the proposed anesthesia with the patient or authorized representative who has indicated his/her understanding and acceptance.     Dental Advisory Given  Plan Discussed with: CRNA and Surgeon  Anesthesia Plan Comments:         Anesthesia Quick Evaluation

## 2023-07-02 NOTE — Anesthesia Postprocedure Evaluation (Signed)
Anesthesia Post Note  Patient: Derek Murphy.  Procedure(s) Performed: COLONOSCOPY WITH PROPOFOL  Anesthesia Type: General Anesthetic complications: no   There were no known notable events for this encounter.   Last Vitals:  Vitals:   07/02/23 1044  BP: 100/64  Pulse: 75  Resp: 18  Temp: (!) 35.9 C  SpO2: 100%    Last Pain:  Vitals:   07/02/23 1044  TempSrc: Temporal  PainSc: 0-No pain                 Kaylynn Chamblin R Jasen Hartstein

## 2023-07-02 NOTE — Anesthesia Postprocedure Evaluation (Signed)
Anesthesia Post Note  Patient: L…O Ridenhour.  Procedure(s) Performed: COLONOSCOPY WITH PROPOFOL  Patient location during evaluation: Endoscopy Anesthesia Type: General Level of consciousness: awake and alert Pain management: pain level controlled Vital Signs Assessment: post-procedure vital signs reviewed and stable Respiratory status: spontaneous breathing, nonlabored ventilation and respiratory function stable Cardiovascular status: blood pressure returned to baseline and stable Postop Assessment: no apparent nausea or vomiting Anesthetic complications: no   There were no known notable events for this encounter.   Last Vitals:  Vitals:   07/02/23 1148 07/02/23 1158  BP: 108/85 (!) 112/95  Pulse: 63 65  Resp:  17  Temp:    SpO2: 100% 100%    Last Pain:  Vitals:   07/02/23 1158  TempSrc:   PainSc: 0-No pain                 Foye Deer

## 2023-07-02 NOTE — H&P (Signed)
  Arlyss Repress, MD [address]  [address]  [place], [place] [address]  Main: [phone]  Fax: [phone] Pager: [phone]  Primary Care Physician:  Pcp, No Primary Gastroenterologist:  Dr. Arlyss Repress  Pre-Procedure History & Physical: HPI:  Derek Murphy. is a 32 y.o. male is here for an colonoscopy.   History reviewed. No pertinent past medical history.  History reviewed. No pertinent surgical history.  Prior to Admission medications   Medication Sig Start Date End Date Taking? Authorizing Provider  dicyclomine (BENTYL) 10 MG capsule Take 1 capsule (10 mg total) by mouth 4 (four) times daily -  before meals and at bedtime. 05/07/23 08/05/23 Yes Celso Amy, PA-C    Allergies as of 05/07/2023   (No Known Allergies)    History reviewed. No pertinent family history.  Social History   Socioeconomic History   Marital status: Single    Spouse name: Not on file   Number of children: Not on file   Years of education: Not on file   Highest education level: Not on file  Occupational History   Not on file  Tobacco Use   Smoking status: Never   Smokeless tobacco: Never  Vaping Use   Vaping status: Every Day  Substance and Sexual Activity   Alcohol use: Yes    Comment: occasional   Drug use: No   Sexual activity: Not on file  Other Topics Concern   Not on file  Social History Narrative   Not on file   Social Determinants of Health   Financial Resource Strain: Not on file  Food Insecurity: Not on file  Transportation Needs: Not on file  Physical Activity: Not on file  Stress: Not on file  Social Connections: Not on file  Intimate Partner Violence: Not on file    Review of Systems: See HPI, otherwise negative ROS  Physical Exam: BP (!) 112/95   Pulse 65   Temp (!) 96.7 F (35.9 C) (Temporal)   Resp 17   Ht 5\' 3"  (1.6 m)   Wt 184 lb 5.9 oz (83.6 kg)   SpO2 100%   BMI 32.66 kg/m  General:   Alert,  pleasant and cooperative in  NAD Head:  Normocephalic and atraumatic. Neck:  Supple; no masses or thyromegaly. Lungs:  Clear throughout to auscultation.    Heart:  Regular rate and rhythm. Abdomen:  Soft, nontender and nondistended. Normal bowel sounds, without guarding, and without rebound.   Neurologic:  Alert and  oriented x4;  grossly normal neurologically.  Impression/Plan: Christin Fudge. is here for an colonoscopy to be performed for chronic diarrhea  Risks, benefits, limitations, and alternatives regarding  colonoscopy have been reviewed with the patient.  Questions have been answered.  All parties agreeable.   Lannette Donath, MD  07/02/2023, 12:10 PM

## 2023-07-03 ENCOUNTER — Encounter: Payer: Self-pay | Admitting: Gastroenterology

## 2023-07-06 ENCOUNTER — Telehealth: Payer: Self-pay

## 2023-07-06 NOTE — Telephone Encounter (Signed)
Patient verbalized understanding of results. He loss the containers for stool test but will come pick up kit and collected them and bring them back

## 2023-07-06 NOTE — Telephone Encounter (Signed)
-----   Message from Celso Amy sent at 07/06/2023  8:06 AM EDT ----- Notify patient of results. If he is having persistent diarrhea, then he should complete stool studies. Follow-up OV if GI symptoms persist. Inetta Fermo ----- Message ----- From: Toney Reil, MD Sent: 07/05/2023   1:09 PM EDT To: Celso Amy, PA-C  Normal pathology on colon biopsies He did not do his stool studies  RV

## 2023-08-07 NOTE — Progress Notes (Deleted)
    Celso Amy, PA-C [address]  [address]  [place], [place] [address]  Main: [phone]  Fax: [phone]   Primary Care Physician: Pcp, No  Primary Gastroenterologist:  ***  CC:  HPI: Haneef Iten. is a 32 y.o. male  Current Outpatient Medications  Medication Sig Dispense Refill   dicyclomine (BENTYL) 10 MG capsule Take 1 capsule (10 mg total) by mouth 4 (four) times daily -  before meals and at bedtime. 90 capsule 2   No current facility-administered medications for this visit.    Allergies as of 08/08/2023   (No Known Allergies)    No past medical history on file.  Past Surgical History:  Procedure Laterality Date   COLONOSCOPY WITH PROPOFOL N/A 07/02/2023   Procedure: COLONOSCOPY WITH PROPOFOL;  Surgeon: Toney Reil, MD;  Location: [facility] ENDOSCOPY;  Service: Gastroenterology;  Laterality: N/A;    Review of Systems:    All systems reviewed and negative except where noted in HPI.   Physical Examination:   There were no vitals taken for this visit.  General: Well-nourished, well-developed in no acute distress.  Lungs: Clear to auscultation bilaterally. Non-labored. Heart: Regular rate and rhythm, no murmurs rubs or gallops.  Abdomen: Bowel sounds are normal; Abdomen is Soft; No hepatosplenomegaly, masses or hernias;  No Abdominal Tenderness; No guarding or rebound tenderness. Neuro: Alert and oriented x 3.  Grossly intact.  Psych: Alert and cooperative, normal mood and affect.   Imaging Studies: No results found.  Assessment and Plan:   Sharone Agne. is a 32 y.o. y/o male ***    Celso Amy, PA-C  Follow up ***  BP check ***

## 2023-08-08 ENCOUNTER — Ambulatory Visit: Payer: 59 | Admitting: Physician Assistant

## 2024-11-02 ENCOUNTER — Emergency Department
Admission: EM | Admit: 2024-11-02 | Discharge: 2024-11-02 | Disposition: A | Discharge status: Home / Self Care | Payer: Self-pay | Attending: Emergency Medicine | Admitting: Emergency Medicine

## 2024-11-02 ENCOUNTER — Other Ambulatory Visit: Payer: Self-pay

## 2024-11-02 DIAGNOSIS — H169 Unspecified keratitis: Secondary | ICD-10-CM | POA: Insufficient documentation

## 2024-11-02 MED ORDER — TETRACAINE HCL 0.5 % OP SOLN
1.0000 [drp] | Freq: Once | OPHTHALMIC | Status: AC
  Administered 2024-11-02: 1 [drp] via OPHTHALMIC
  Filled 2024-11-02: qty 4

## 2024-11-02 MED ORDER — MOXIFLOXACIN HCL 0.5 % OP SOLN: 1.0000 [drp] | Freq: Three times a day (TID) | OPHTHALMIC | 0 refills | Status: AC

## 2024-11-02 MED ORDER — LOTEPREDNOL ETABONATE 0.5 % OP SUSP: 1.0000 [drp] | Freq: Four times a day (QID) | OPHTHALMIC | 0 refills | Status: AC

## 2024-11-02 MED ORDER — FLUORESCEIN SODIUM 1 MG OP STRP
1.0000 | ORAL_STRIP | Freq: Once | OPHTHALMIC | Status: AC
  Administered 2024-11-02: 1 via OPHTHALMIC
  Filled 2024-11-02: qty 1

## 2024-11-02 NOTE — ED Triage Notes (Addendum)
 Pt comes in via pov with complaints of right eye swelling, discomfort, drainage and swelling that started a couple days ago. Pt states that it has continued to get worse, and now his vision has gotten very blurry in that eye. Pt has attempted to flush eye with no relief.  Pt with complaints of pain 5/10.

## 2024-11-02 NOTE — ED Provider Notes (Signed)
 [facility] Provider Note    Event Date/Time   First MD Initiated Contact with Patient 11/02/24 1159     (approximate)   History   Facial Swelling   HPI  Derek Cantara. is a 33 y.o. male who presents today for evaluation of right eye tearing, redness, and foreign body sensation for the last couple of days.  Patient reports that he noticed an eyelash in his eye and so he tried to flush it out and rub it out, and pain has persisted.  He also noticed that he has had more tearing from that time.  No light sensitivity.  No headache.  Does not wear contact lenses.  Patient Active Problem List   Diagnosis Date Noted   Diarrhea 07/02/2023   Alcohol intoxication, uncomplicated 05/07/2023   Tonsil, abscess 03/21/2016          Physical Exam   Triage Vital Signs: ED Triage Vitals [11/02/24 1152]  Encounter Vitals Group     BP 116/70     Girls Systolic BP Percentile      Girls Diastolic BP Percentile      Boys Systolic BP Percentile      Boys Diastolic BP Percentile      Pulse Rate 92     Resp 18     Temp 98.2 F (36.8 C)     Temp src      SpO2 96 %     Weight 197 lb (89.4 kg)     Height 5' 3 (1.6 m)     Head Circumference      Peak Flow      Pain Score 5     Pain Loc      Pain Education      Exclude from Growth Chart     Most recent vital signs: Vitals:   11/02/24 1152  BP: 116/70  Pulse: 92  Resp: 18  Temp: 98.2 F (36.8 C)  SpO2: 96%    Physical Exam Vitals and nursing note reviewed.  Constitutional:      General: Awake and alert. No acute distress.    Appearance: Normal appearance. The patient is normal weight.  HENT:     Head: Normocephalic and atraumatic.     Mouth: Mucous membranes are moist.  Eyes:     General: PERRL. Normal EOMs        Right eye: Mild tearing noted.  Pupil is round and reactive.  Mild scleral injection noted.  Normal lids and lashes.  No proptosis.  No hyphema or hypopyon.   Normal ocular pressure  of 13.  Diffuse punctate fluorescein  uptake noted.  No retained foreign body.  No proptosis.    Left eye: No discharge.  Cardiovascular:     Rate and Rhythm: Normal rate and regular rhythm.     Pulses: Normal pulses.  Pulmonary:     Effort: Pulmonary effort is normal. No respiratory distress.     Breath sounds: Normal breath sounds.  Abdominal:     Abdomen is soft. There is no abdominal tenderness. No rebound or guarding. No distention. Musculoskeletal:        General: No swelling. Normal range of motion.     Cervical back: Normal range of motion and neck supple.  Skin:    General: Skin is warm and dry.     Capillary Refill: Capillary refill takes less than 2 seconds.     Findings: No rash.  Neurological:     Mental Status:  The patient is awake and alert.      ED Results / Procedures / Treatments   Labs (all labs ordered are listed, but only abnormal results are displayed) Labs Reviewed - No data to display   EKG     RADIOLOGY     PROCEDURES:  Critical Care performed:   Procedures   MEDICATIONS ORDERED IN ED: Medications  fluorescein  ophthalmic strip 1 strip (1 strip Right Eye Given by Other 11/02/24 1207)  tetracaine  (PONTOCAINE) 0.5 % ophthalmic solution 1-2 drop (1 drop Right Eye Given by Other 11/02/24 1207)     IMPRESSION / MDM / ASSESSMENT AND PLAN / ED COURSE  I reviewed the triage vital signs and the nursing notes.   Differential diagnosis includes, but is not limited to, corneal abrasion, keratitis, foreign body  Patient is awake and alert, hemodynamically stable and afebrile.  He does have a painful and erythematous eye with light sensitivity.  Normal ocular pressure of 13, not consistent with acute angle-closure glaucoma.  He has diffuse punctate fluorescein  uptake, with scleral injection and light sensitivity most consistent with keratitis.  No foreign body noted on exam.  No history of autoimmune disease.   I consulted ophthalmology, and  discussed with Derek Murphy who agrees that this sounds like a keratitis.  He recommends that we initiate loteprednol  drops and Vigamox  drops.  Also recommended follow-up tomorrow in the [place] office.  Patient agrees with this plan of care.  We discussed return precautions in the meantime.  Patient was discharged in stable condition.   Patient's presentation is most consistent with acute complicated illness / injury requiring diagnostic workup.   Clinical Course as of 11/02/24 1413  Sun Nov 02, 2024  1306 20/20 bilaterally [JP]  1310 Discussed with Derek Murphy: use floromethalone OR lodamax (lodoprednol) drops QID, vigamox . Wait 10 minutes between drops. F/u tomorrow in [place] [JP]    Clinical Course User Index [JP] John Williamsen E, PA-C     FINAL CLINICAL IMPRESSION(S) / ED DIAGNOSES   Final diagnoses:  Keratitis     Rx / DC Orders   ED Discharge Orders          Ordered    loteprednol  (LOTEMAX ) 0.5 % ophthalmic suspension  4 times daily        11/02/24 1341    moxifloxacin  (VIGAMOX ) 0.5 % ophthalmic solution  3 times daily        11/02/24 1341             Note:  This document was prepared using Dragon voice recognition software and may include unintentional dictation errors.   Brendan Gruwell E, PA-C 11/02/24 1413    Ernest Ronal BRAVO, MD 11/03/24 [phone]

## 2024-11-02 NOTE — Discharge Instructions (Signed)
 Please follow-up with Dr. Enola in his office tomorrow at [address] [place], [place] [address].  In the meantime, please alternate the drops as prescribed, wait at least 10 minutes in between drops. Please return for any new, worsening, or changing symptoms or other concerns.  It was a pleasure caring for you today.
